# Patient Record
Sex: Male | Born: 1955 | Race: White | Hispanic: No | Marital: Married | Smoking: Never smoker
Health system: Southern US, Community
[De-identification: ages and names within clinical notes are randomized; demographics above are authoritative.]

## PROBLEM LIST (undated history)

## (undated) DIAGNOSIS — E785 Hyperlipidemia, unspecified: Secondary | ICD-10-CM

## (undated) DIAGNOSIS — E781 Pure hyperglyceridemia: Secondary | ICD-10-CM

## (undated) DIAGNOSIS — N63 Unspecified lump in unspecified breast: Secondary | ICD-10-CM

## (undated) DIAGNOSIS — I10 Essential (primary) hypertension: Secondary | ICD-10-CM

## (undated) DIAGNOSIS — I2510 Atherosclerotic heart disease of native coronary artery without angina pectoris: Secondary | ICD-10-CM

## (undated) HISTORY — DX: Hyperlipidemia, unspecified: E78.5

---

## 1995-01-22 HISTORY — PX: BICEPS TENDON REPAIR: SHX566

## 2012-12-02 HISTORY — PX: CORONARY ANGIOPLASTY WITH STENT PLACEMENT: SHX49

## 2014-07-17 ENCOUNTER — Encounter (HOSPITAL_BASED_OUTPATIENT_CLINIC_OR_DEPARTMENT_OTHER): Payer: Self-pay | Admitting: Emergency Medicine

## 2014-07-17 ENCOUNTER — Emergency Department (HOSPITAL_BASED_OUTPATIENT_CLINIC_OR_DEPARTMENT_OTHER)
Admission: EM | Admit: 2014-07-17 | Discharge: 2014-07-17 | Disposition: A | Discharge status: Home / Self Care | Payer: BLUE CROSS/BLUE SHIELD | Attending: Emergency Medicine | Admitting: Emergency Medicine

## 2014-07-17 ENCOUNTER — Emergency Department (HOSPITAL_BASED_OUTPATIENT_CLINIC_OR_DEPARTMENT_OTHER): Payer: BLUE CROSS/BLUE SHIELD

## 2014-07-17 DIAGNOSIS — S61211A Laceration without foreign body of left index finger without damage to nail, initial encounter: Secondary | ICD-10-CM | POA: Diagnosis not present

## 2014-07-17 DIAGNOSIS — S61219A Laceration without foreign body of unspecified finger without damage to nail, initial encounter: Secondary | ICD-10-CM

## 2014-07-17 DIAGNOSIS — Z880 Allergy status to penicillin: Secondary | ICD-10-CM | POA: Diagnosis not present

## 2014-07-17 DIAGNOSIS — Z23 Encounter for immunization: Secondary | ICD-10-CM | POA: Diagnosis not present

## 2014-07-17 DIAGNOSIS — Y998 Other external cause status: Secondary | ICD-10-CM | POA: Diagnosis not present

## 2014-07-17 DIAGNOSIS — Y9289 Other specified places as the place of occurrence of the external cause: Secondary | ICD-10-CM | POA: Insufficient documentation

## 2014-07-17 DIAGNOSIS — W260XXA Contact with knife, initial encounter: Secondary | ICD-10-CM | POA: Insufficient documentation

## 2014-07-17 DIAGNOSIS — I10 Essential (primary) hypertension: Secondary | ICD-10-CM | POA: Diagnosis not present

## 2014-07-17 DIAGNOSIS — I252 Old myocardial infarction: Secondary | ICD-10-CM | POA: Diagnosis not present

## 2014-07-17 DIAGNOSIS — Y93G9 Activity, other involving cooking and grilling: Secondary | ICD-10-CM | POA: Insufficient documentation

## 2014-07-17 HISTORY — DX: Essential (primary) hypertension: I10

## 2014-07-17 MED ORDER — LIDOCAINE HCL (PF) 1 % IJ SOLN
INTRAMUSCULAR | Status: AC
  Administered 2014-07-17: 10 mL
  Filled 2014-07-17: qty 5

## 2014-07-17 MED ORDER — BACITRACIN 500 UNIT/GM EX OINT
1.0000 "application " | TOPICAL_OINTMENT | Freq: Two times a day (BID) | CUTANEOUS | Status: DC
  Administered 2014-07-17: 1 via TOPICAL
  Filled 2014-07-17: qty 0.9

## 2014-07-17 MED ORDER — TETANUS-DIPHTH-ACELL PERTUSSIS 5-2.5-18.5 LF-MCG/0.5 IM SUSP
0.5000 mL | Freq: Once | INTRAMUSCULAR | Status: AC
  Administered 2014-07-17: 0.5 mL via INTRAMUSCULAR
  Filled 2014-07-17: qty 0.5

## 2014-07-17 MED ORDER — LIDOCAINE HCL (PF) 1 % IJ SOLN
10.0000 mL | Freq: Once | INTRAMUSCULAR | Status: AC
  Administered 2014-07-17: 10 mL

## 2014-07-17 NOTE — ED Provider Notes (Signed)
CSN: [id number]     Arrival date & time 07/17/14  1851 History   First MD Initiated Contact with Patient 07/17/14 2019     Chief Complaint  Patient presents with  . Extremity Laceration     (Consider location/radiation/quality/duration/timing/severity/associated sxs/prior Treatment) HPI   Blood pressure 139/94, pulse 60, temperature 98.4 F (36.9 C), temperature source Oral, resp. rate 18, height 6' (1.829 m), weight 220 lb (99.791 kg), SpO2 95 %.  Gregory Delacruz is a 59 y.o. male w/ h/o HTN and MI with stent placement who presents to the ED today with a laceration to the dorsal aspect of his left index finger.  He cut his finger with a serrated knife at 6:30 while cutting corn, he immediately applied pressure to the wound site and controlled the bleeding well.  He is ambidextrous, takes a baby aspirin daily, and his last tetanus shot was over 20 years ago.   Past Medical History  Diagnosis Date  . Hypertension   . Heart attack    Past Surgical History  Procedure Laterality Date  . Coronary angioplasty with stent placement     History reviewed. No pertinent family history. History  Substance Use Topics  . Smoking status: Never Smoker   . Smokeless tobacco: Not on file  . Alcohol Use: Yes     Comment: occasional    Review of Systems  10 systems reviewed and found to be negative, except as noted in the HPI.  Allergies  Penicillins  Home Medications   Prior to Admission medications   Not on File   BP 139/94 mmHg  Pulse 60  Temp(Src) 98.4 F (36.9 C) (Oral)  Resp 18  Ht 6' (1.829 m)  Wt 220 lb (99.791 kg)  BMI 29.83 kg/m2  SpO2 95% Physical Exam  Constitutional: He is oriented to person, place, and time. He appears well-developed and well-nourished. No distress.  HENT:  Head: Normocephalic.  Eyes: Conjunctivae and EOM are normal.  Cardiovascular: Normal rate.   Pulmonary/Chest: Effort normal. No stridor.  Musculoskeletal: Normal range of motion.   Hands: 3 cm flap-like laceration to dorsum of middle phalanx of left second digit.  Neurological: He is alert and oriented to person, place, and time.  Psychiatric: He has a normal mood and affect.  Nursing note and vitals reviewed.   ED Course  LACERATION REPAIR Date/Time: 07/17/2014 10:21 PM Performed by: Monico Blitz Authorized by: Monico Blitz Consent given by: patient Body area: upper extremity Location details: left index finger Laceration length: 3 cm Foreign bodies: no foreign bodies Tendon involvement: none Nerve involvement: none Vascular damage: no Anesthesia: digital block Local anesthetic: lidocaine 1% without epinephrine Anesthetic total: 5 ml Preparation: Patient was prepped and draped in the usual sterile fashion. Irrigation solution: saline Irrigation method: syringe Amount of cleaning: extensive Debridement: none Degree of undermining: none Skin closure: Ethilon (6-0) Number of sutures: 7 Technique: simple Approximation: close Approximation difficulty: simple Dressing: antibiotic ointment and splint Patient tolerance: Patient tolerated the procedure well with no immediate complications Comments: Wound explored to depth in good light on a bloodless field no foreign bodies are appreciated by visualization or by palpation.    SPLINT APPLICATION Date/Time: 70:62 PM Authorized by: Monico Blitz Consent: Verbal consent obtained. Risks and benefits: risks, benefits and alternatives were discussed Consent given by: patient Splint applied by: EMT Location details: Left index finger Splint type: Metal finger splint Post-procedure: The splinted body part was neurovascularly unchanged following the procedure. Patient tolerance: Patient tolerated the procedure well  with no immediate complications.    Labs Review Labs Reviewed - No data to display  Imaging Review Dg Finger Index Left  07/17/2014   CLINICAL DATA:  Laceration left index finger   EXAM: LEFT INDEX FINGER 2+V  COMPARISON:  None.  FINDINGS: No fracture or dislocation. 3 mm subtle geometric focus of increased density in the soft tissues along the ulnar margin of the second middle phalanx.  IMPRESSION: No fracture.  Possible focus of foreign body.   Electronically Signed   By: Skipper Cliche M.D.   On: 07/17/2014 19:45     EKG Interpretation None      MDM   Final diagnoses:  Finger laceration, initial encounter    Filed Vitals:   07/17/14 1901  BP: 139/94  Pulse: 60  Temp: 98.4 F (36.9 C)  TempSrc: Oral  Resp: 18  Height: 6' (1.829 m)  Weight: 220 lb (99.791 kg)  SpO2: 95%    Medications  lidocaine (PF) (XYLOCAINE) 1 % injection 10 mL (10 mLs Infiltration Given 07/17/14 2143)  Tdap (BOOSTRIX) injection 0.5 mL (0.5 mLs Intramuscular Given 07/17/14 2152)    Gregory Delacruz is a pleasant 59 y.o. male presenting with recent meter flap-like laceration to dorsum of left middle phalanx. X-rays read as possible foreign body however there is excellent visualization of this wound, no foreign bodies are in the wound. I have done an extensive irrigation and exploration. Wound is cleaned and closed. Extensive discussion of return precautions.  Evaluation does not show pathology that would require ongoing emergent intervention or inpatient treatment. Pt is hemodynamically stable and mentating appropriately. Discussed findings and plan with patient/guardian, who agrees with care plan. All questions answered. Return precautions discussed and outpatient follow up given.        Monico Blitz, PA-C 07/17/14 [place], MD 07/17/14 [phone]

## 2014-07-17 NOTE — Discharge Instructions (Signed)
Keep wound dry and do not remove dressing for 24 hours if possible. After that, wash gently morning and night (every 12 hours) with soap and water. Use a topical antibiotic ointment and cover with a bandaid or gauze.    Do NOT use rubbing alcohol or hydrogen peroxide, do not soak the area   Present to your primary care doctor or the urgent care of your choice, or the ED for suture removal in 7 days.   Every attempt was made to remove foreign body (contaminants) from the wound.  However, there is always a chance that some may remain in the wound. This can  increase your risk of infection.   If you see signs of infection (warmth, redness, tenderness, pus, sharp increase in pain, fever, red streaking in the skin) immediately return to the emergency department.   After the wound heals fully, apply sunscreen for 6-12 months to minimize scarring.   Laceration Care, Adult A laceration is a cut or lesion that goes through all layers of the skin and into the tissue just beneath the skin. TREATMENT  Some lacerations may not require closure. Some lacerations may not be able to be closed due to an increased risk of infection. It is important to see your caregiver as soon as possible after an injury to minimize the risk of infection and maximize the opportunity for successful closure. If closure is appropriate, pain medicines may be given, if needed. The wound will be cleaned to help prevent infection. Your caregiver will use stitches (sutures), staples, wound glue (adhesive), or skin adhesive strips to repair the laceration. These tools bring the skin edges together to allow for faster healing and a better cosmetic outcome. However, all wounds will heal with a scar. Once the wound has healed, scarring can be minimized by covering the wound with sunscreen during the day for 1 full year. HOME CARE INSTRUCTIONS  For sutures or staples:  Keep the wound clean and dry.  If you were given a bandage (dressing),  you should change it at least once a day. Also, change the dressing if it becomes wet or dirty, or as directed by your caregiver.  Wash the wound with soap and water 2 times a day. Rinse the wound off with water to remove all soap. Pat the wound dry with a clean towel.  After cleaning, apply a thin layer of the antibiotic ointment as recommended by your caregiver. This will help prevent infection and keep the dressing from sticking.  You may shower as usual after the first 24 hours. Do not soak the wound in water until the sutures are removed.  Only take over-the-counter or prescription medicines for pain, discomfort, or fever as directed by your caregiver.  Get your sutures or staples removed as directed by your caregiver. For skin adhesive strips:  Keep the wound clean and dry.  Do not get the skin adhesive strips wet. You may bathe carefully, using caution to keep the wound dry.  If the wound gets wet, pat it dry with a clean towel.  Skin adhesive strips will fall off on their own. You may trim the strips as the wound heals. Do not remove skin adhesive strips that are still stuck to the wound. They will fall off in time. For wound adhesive:  You may briefly wet your wound in the shower or bath. Do not soak or scrub the wound. Do not swim. Avoid periods of heavy perspiration until the skin adhesive has fallen off on  its own. After showering or bathing, gently pat the wound dry with a clean towel.  Do not apply liquid medicine, cream medicine, or ointment medicine to your wound while the skin adhesive is in place. This may loosen the film before your wound is healed.  If a dressing is placed over the wound, be careful not to apply tape directly over the skin adhesive. This may cause the adhesive to be pulled off before the wound is healed.  Avoid prolonged exposure to sunlight or tanning lamps while the skin adhesive is in place. Exposure to ultraviolet light in the first year will  darken the scar.  The skin adhesive will usually remain in place for 5 to 10 days, then naturally fall off the skin. Do not pick at the adhesive film. You may need a tetanus shot if:  You cannot remember when you had your last tetanus shot.  You have never had a tetanus shot. If you get a tetanus shot, your arm may swell, get red, and feel warm to the touch. This is common and not a problem. If you need a tetanus shot and you choose not to have one, there is a rare chance of getting tetanus. Sickness from tetanus can be serious. SEEK MEDICAL CARE IF:   You have redness, swelling, or increasing pain in the wound.  You see a red line that goes away from the wound.  You have yellowish-white fluid (pus) coming from the wound.  You have a fever.  You notice a bad smell coming from the wound or dressing.  Your wound breaks open before or after sutures have been removed.  You notice something coming out of the wound such as wood or glass.  Your wound is on your hand or foot and you cannot move a finger or toe. SEEK IMMEDIATE MEDICAL CARE IF:   Your pain is not controlled with prescribed medicine.  You have severe swelling around the wound causing pain and numbness or a change in color in your arm, hand, leg, or foot.  Your wound splits open and starts bleeding.  You have worsening numbness, weakness, or loss of function of any joint around or beyond the wound.  You develop painful lumps near the wound or on the skin anywhere on your body. MAKE SURE YOU:   Understand these instructions.  Will watch your condition.  Will get help right away if you are not doing well or get worse. Document Released: 01/07/2005 Document Revised: 04/01/2011 Document Reviewed: 07/03/2010 [facility] Patient Information 2015 [place], [place]. This information is not intended to replace advice given to you by your health care provider. Make sure you discuss any questions you have with your health care  provider.

## 2014-07-17 NOTE — ED Notes (Signed)
Pt in with laceration to L index finger. States she was cutting food and cut his finger in a filet fashion. Dressed and bleeding controlled at this time.

## 2014-07-22 ENCOUNTER — Telehealth: Payer: Self-pay | Admitting: *Deleted

## 2014-07-22 ENCOUNTER — Encounter: Payer: Self-pay | Admitting: *Deleted

## 2014-07-22 NOTE — Telephone Encounter (Deleted)
Pre-Visit Call completed with patient and chart updated.   Pre-Visit Info documented in Specialty Comments under [employer].    

## 2014-07-22 NOTE — Telephone Encounter (Signed)
Pre-Visit Call completed with patient and chart updated.   Pre-Visit Info documented in Specialty Comments under [employer].    

## 2014-07-22 NOTE — Telephone Encounter (Signed)
Unable to reach patient at time of Pre-Visit Call.  Left message for patient to return call when available.    

## 2014-07-22 NOTE — Addendum Note (Signed)
Addended by: Leticia Penna A on: 07/22/2014 11:48 AM   Modules accepted: Medications

## 2014-07-26 ENCOUNTER — Ambulatory Visit (INDEPENDENT_AMBULATORY_CARE_PROVIDER_SITE_OTHER): Payer: BLUE CROSS/BLUE SHIELD | Admitting: Physician Assistant

## 2014-07-26 ENCOUNTER — Encounter: Payer: Self-pay | Admitting: Physician Assistant

## 2014-07-26 ENCOUNTER — Other Ambulatory Visit: Payer: Self-pay | Admitting: Physician Assistant

## 2014-07-26 VITALS — BP 138/80 | HR 55 | Temp 97.8°F | Ht 70.5 in | Wt 251.6 lb

## 2014-07-26 DIAGNOSIS — G44209 Tension-type headache, unspecified, not intractable: Secondary | ICD-10-CM

## 2014-07-26 DIAGNOSIS — I2510 Atherosclerotic heart disease of native coronary artery without angina pectoris: Secondary | ICD-10-CM | POA: Insufficient documentation

## 2014-07-26 DIAGNOSIS — Z4802 Encounter for removal of sutures: Secondary | ICD-10-CM | POA: Diagnosis not present

## 2014-07-26 DIAGNOSIS — S61219D Laceration without foreign body of unspecified finger without damage to nail, subsequent encounter: Secondary | ICD-10-CM

## 2014-07-26 DIAGNOSIS — I10 Essential (primary) hypertension: Secondary | ICD-10-CM | POA: Insufficient documentation

## 2014-07-26 DIAGNOSIS — S61219A Laceration without foreign body of unspecified finger without damage to nail, initial encounter: Secondary | ICD-10-CM | POA: Insufficient documentation

## 2014-07-26 DIAGNOSIS — Z299 Encounter for prophylactic measures, unspecified: Secondary | ICD-10-CM

## 2014-07-26 LAB — HEMOGLOBIN A1C: Hgb A1c MFr Bld: 5.2 % (ref 4.6–6.5)

## 2014-07-26 LAB — URINALYSIS, ROUTINE W REFLEX MICROSCOPIC
Bilirubin Urine: NEGATIVE
Hgb urine dipstick: NEGATIVE
KETONES UR: NEGATIVE
Leukocytes, UA: NEGATIVE
Nitrite: NEGATIVE
PH: 6.5 (ref 5.0–8.0)
SPECIFIC GRAVITY, URINE: 1.01 (ref 1.000–1.030)
TOTAL PROTEIN, URINE-UPE24: NEGATIVE
UROBILINOGEN UA: 0.2 (ref 0.0–1.0)
Urine Glucose: NEGATIVE
WBC UA: NONE SEEN — AB (ref 0–?)

## 2014-07-26 LAB — CBC
HEMATOCRIT: 50.2 % (ref 39.0–52.0)
HEMOGLOBIN: 17.5 g/dL — AB (ref 13.0–17.0)
MCHC: 34.9 g/dL (ref 30.0–36.0)
MCV: 90.7 fl (ref 78.0–100.0)
Platelets: 220 10*3/uL (ref 150.0–400.0)
RBC: 5.53 Mil/uL (ref 4.22–5.81)
RDW: 12.7 % (ref 11.5–15.5)
WBC: 7.4 10*3/uL (ref 4.0–10.5)

## 2014-07-26 LAB — COMPREHENSIVE METABOLIC PANEL
ALBUMIN: 4.1 g/dL (ref 3.5–5.2)
ALT: 15 U/L (ref 0–53)
AST: 23 U/L (ref 0–37)
Alkaline Phosphatase: 65 U/L (ref 39–117)
BUN: 15 mg/dL (ref 6–23)
CALCIUM: 9.6 mg/dL (ref 8.4–10.5)
CO2: 20 meq/L (ref 19–32)
CREATININE: 0.97 mg/dL (ref 0.40–1.50)
Chloride: 104 mEq/L (ref 96–112)
GFR: 84.88 mL/min (ref >60.00)
Glucose, Bld: 98 mg/dL (ref 70–99)
Potassium: 4.2 mEq/L (ref 3.5–5.1)
Sodium: 135 mEq/L (ref 135–145)
Total Bilirubin: 0.5 mg/dL (ref 0.2–1.2)
Total Protein: 7 g/dL (ref 6.0–8.3)

## 2014-07-26 LAB — TSH: TSH: 1.8 u[IU]/mL (ref 0.35–4.50)

## 2014-07-26 LAB — LIPID PANEL
CHOL/HDL RATIO: 9
CHOLESTEROL: 276 mg/dL — AB (ref 0–200)
HDL: 30.9 mg/dL — ABNORMAL LOW (ref >39.00)
Triglycerides: 1442 mg/dL — ABNORMAL HIGH (ref 0.0–149.0)

## 2014-07-26 LAB — LDL CHOLESTEROL, DIRECT: LDL DIRECT: 86 mg/dL

## 2014-07-26 LAB — PSA: PSA: 0.5 ng/mL (ref 0.10–4.00)

## 2014-07-26 MED ORDER — EPINEPHRINE 0.3 MG/0.3ML IJ SOAJ: 0.3000 mg | Freq: Once | INTRAMUSCULAR | Status: DC

## 2014-07-26 MED ORDER — ATENOLOL 25 MG PO TABS
25.0000 mg | ORAL_TABLET | Freq: Two times a day (BID) | ORAL | Status: DC
Start: 2014-07-26 — End: 2015-09-18

## 2014-07-26 MED ORDER — TIZANIDINE HCL 2 MG PO TABS: 2.0000 mg | ORAL_TABLET | Freq: Three times a day (TID) | ORAL | Status: DC | PRN

## 2014-07-26 NOTE — Assessment & Plan Note (Signed)
Wound healing nicely. No evidence of infection. Sutures removed with kit. No complications. Follow-up PRN. Signs/symptoms of infection discuss with patient.

## 2014-07-26 NOTE — Assessment & Plan Note (Signed)
Stable. Asymptomatic. Continue BB.

## 2014-07-26 NOTE — Progress Notes (Signed)
Pre visit review using our clinic review tool, if applicable. No additional management support is needed unless otherwise documented below in the visit note. 

## 2014-07-26 NOTE — Progress Notes (Signed)
Gregory Delacruz presents to clinic today to establish care.  Acute Concerns: Gregory Delacruz seen in ER one week ago for uncomplicated laceration to left index finger. Sutures were placed. Gregory Delacruz endorses good healing. Denies fever, chills, pain, redness, warmth or drainage at site. Is due for suture removal.  Chronic Issues: Hypertension -- S/P MI in 2009 with stent placed in LAD. Was previously followed by Cardiology in [facility] but has not established new specialist since move to this area. Is currently taking Atenolol 25 mg BID. Endorses Bp previously well controlled. Gregory Delacruz denies chest pain, palpitations, lightheadedness, dizziness, vision changes or frequent headaches. Is taking ASA 81 mg daily. Endorses previously on Lipitor but stopped medication due to myalgias.   Past Medical History  Diagnosis Date  . Hypertension   . Heart attack     Past Surgical History  Procedure Laterality Date  . Coronary angioplasty with stent placement      Current Outpatient Prescriptions on File Prior to Visit  Medication Sig Dispense Refill  . aspirin 81 MG tablet Take 81 mg by mouth daily.     No current facility-administered medications on file prior to visit.    Allergies  Allergen Reactions  . Shellfish Allergy Swelling  . Penicillins Itching    Family History  Problem Relation Age of Onset  . Diabetes Mother   . Hypertension Mother   . Cancer Father 33    prostate   . Heart disease Father 62    triple bypass   . Diabetes Father   . Hypertension Father     History   Social History  . Marital Status: Married    Spouse Name: N/A  . Number of Children: N/A  . Years of Education: N/A   Occupational History  . Not on file.   Social History Main Topics  . Smoking status: Never Smoker   . Smokeless tobacco: Never Used  . Alcohol Use: 0.0 oz/week    0 Standard drinks or equivalent per week     Comment: occasional  . Drug Use: No  . Sexual Activity: Not on file   Other  Topics Concern  . Not on file   Social History Narrative   Review of Systems  Respiratory: Negative for cough and shortness of breath.   Cardiovascular: Negative for chest pain and palpitations.  Musculoskeletal: Positive for neck pain.  Neurological: Positive for headaches. Negative for dizziness and loss of consciousness.  Psychiatric/Behavioral: Negative for depression. The Gregory Delacruz does not have insomnia.    BP 138/80 mmHg  Pulse 55  Temp(Src) 97.8 F (36.6 C) (Oral)  Ht 5' 10.5" (1.791 m)  Wt 251 lb 9.6 oz (114.125 kg)  BMI 35.58 kg/m2  SpO2 98%  Physical Exam  Constitutional: He is oriented to person, place, and time.  Cardiovascular: Normal rate, regular rhythm, normal heart sounds and intact distal pulses.   Pulmonary/Chest: Effort normal and breath sounds normal. No respiratory distress. He has no wheezes. He has no rales. He exhibits no tenderness.  Musculoskeletal:       Cervical back: He exhibits spasm. He exhibits no tenderness and no bony tenderness.  Neurological: He is alert and oriented to person, place, and time.  Skin: Skin is warm and dry. No rash noted.  Healed laceration of posterior L index finger with sutures in place (7)  Psychiatric: Affect normal.  Vitals reviewed.  Assessment/Plan: Finger laceration Wound healing nicely. No evidence of infection. Sutures removed with kit. No complications. Follow-up PRN. Signs/symptoms of  infection discuss with Gregory Delacruz.  Essential hypertension Stable. Asymptomatic. Continue BB.  Coronary artery disease S/p MI in 2009. 100% blockage of LAD per Gregory Delacruz. Stent placed. BP controlled. Will check fasting lipid panel. Continue BB therapy. Will restart statin with dose based on lipids. Referral to Cardiology placed.  Tension headache Topical Aspercreme. Supportive measures reviewed. Rx Zanaflex for acute headache. No driving while on medication. Follow-up if symptoms are not resolving.

## 2014-07-26 NOTE — Patient Instructions (Signed)
Please keep wound clean and dry. It is healing nicely. If you notice any redness, warmth, drainage or increased tenderness, please come see me.  Stop by the lab for blood work. We will discuss results at your physical in 1 week.

## 2014-07-26 NOTE — Assessment & Plan Note (Signed)
Topical Aspercreme. Supportive measures reviewed. Rx Zanaflex for acute headache. No driving while on medication. Follow-up if symptoms are not resolving.

## 2014-07-26 NOTE — Assessment & Plan Note (Signed)
S/p MI in 2009. 100% blockage of LAD per patient. Stent placed. BP controlled. Will check fasting lipid panel. Continue BB therapy. Will restart statin with dose based on lipids. Referral to Cardiology placed.

## 2014-07-27 MED ORDER — FENOFIBRATE 145 MG PO TABS: 145.0000 mg | ORAL_TABLET | Freq: Every day | ORAL | Status: DC

## 2014-07-27 NOTE — Addendum Note (Signed)
Addended by: Leticia Penna A on: 07/27/2014 04:05 PM   Modules accepted: Orders

## 2014-08-02 ENCOUNTER — Telehealth: Payer: Self-pay | Admitting: Behavioral Health

## 2014-08-02 NOTE — Telephone Encounter (Signed)
Unable to reach patient at time of Pre-Visit Call.  Left message for patient to return call when available.    

## 2014-08-03 ENCOUNTER — Ambulatory Visit (INDEPENDENT_AMBULATORY_CARE_PROVIDER_SITE_OTHER): Payer: BLUE CROSS/BLUE SHIELD | Admitting: Physician Assistant

## 2014-08-03 ENCOUNTER — Encounter: Payer: Self-pay | Admitting: Physician Assistant

## 2014-08-03 ENCOUNTER — Encounter: Payer: Self-pay | Admitting: Gastroenterology

## 2014-08-03 VITALS — BP 139/89 | HR 55 | Temp 97.9°F | Ht 70.0 in | Wt 249.8 lb

## 2014-08-03 DIAGNOSIS — E781 Pure hyperglyceridemia: Secondary | ICD-10-CM

## 2014-08-03 DIAGNOSIS — Z1211 Encounter for screening for malignant neoplasm of colon: Secondary | ICD-10-CM | POA: Diagnosis not present

## 2014-08-03 DIAGNOSIS — I10 Essential (primary) hypertension: Secondary | ICD-10-CM | POA: Diagnosis not present

## 2014-08-03 DIAGNOSIS — Z0000 Encounter for general adult medical examination without abnormal findings: Secondary | ICD-10-CM | POA: Insufficient documentation

## 2014-08-03 DIAGNOSIS — H6123 Impacted cerumen, bilateral: Secondary | ICD-10-CM | POA: Diagnosis not present

## 2014-08-03 DIAGNOSIS — H6120 Impacted cerumen, unspecified ear: Secondary | ICD-10-CM | POA: Insufficient documentation

## 2014-08-03 NOTE — Assessment & Plan Note (Signed)
Successfully removed via irrigation with resolution of symptoms. 

## 2014-08-03 NOTE — Patient Instructions (Addendum)
Please continue medications as directed.  Continue limiting alcohol consumption.  Return to the lab in 4 weeks for repeat fasting cholesterol panel to reassess your triglycerides. I have already placed the orders.  Follow-up with me in 6 months for chronic medical conditions.  Preventive Care for Adults A healthy lifestyle and preventive care can promote health and wellness. Preventive health guidelines for men include the following key practices:  A routine yearly physical is a good way to check with your health care provider about your health and preventative screening. It is a chance to share any concerns and updates on your health and to receive a thorough exam.  Visit your dentist for a routine exam and preventative care every 6 months. Brush your teeth twice a day and floss once a day. Good oral hygiene prevents tooth decay and gum disease.  The frequency of eye exams is based on your age, health, family medical history, use of contact lenses, and other factors. Follow your health care provider's recommendations for frequency of eye exams.  Eat a healthy diet. Foods such as vegetables, fruits, whole grains, low-fat dairy products, and lean protein foods contain the nutrients you need without too many calories. Decrease your intake of foods high in solid fats, added sugars, and salt. Eat the right amount of calories for you.Get information about a proper diet from your health care provider, if necessary.  Regular physical exercise is one of the most important things you can do for your health. Most adults should get at least 150 minutes of moderate-intensity exercise (any activity that increases your heart rate and causes you to sweat) each week. In addition, most adults need muscle-strengthening exercises on 2 or more days a week.  Maintain a healthy weight. The body mass index (BMI) is a screening tool to identify possible weight problems. It provides an estimate of body fat based on  height and weight. Your health care provider can find your BMI and can help you achieve or maintain a healthy weight.For adults 20 years and older:  A BMI below 18.5 is considered underweight.  A BMI of 18.5 to 24.9 is normal.  A BMI of 25 to 29.9 is considered overweight.  A BMI of 30 and above is considered obese.  Maintain normal blood lipids and cholesterol levels by exercising and minimizing your intake of saturated fat. Eat a balanced diet with plenty of fruit and vegetables. Blood tests for lipids and cholesterol should begin at age 78 and be repeated every 5 years. If your lipid or cholesterol levels are high, you are over 50, or you are at high risk for heart disease, you may need your cholesterol levels checked more frequently.Ongoing high lipid and cholesterol levels should be treated with medicines if diet and exercise are not working.  If you smoke, find out from your health care provider how to quit. If you do not use tobacco, do not start.  Lung cancer screening is recommended for adults aged 23-80 years who are at high risk for developing lung cancer because of a history of smoking. A yearly low-dose CT scan of the lungs is recommended for people who have at least a 30-pack-year history of smoking and are a current smoker or have quit within the past 15 years. A pack year of smoking is smoking an average of 1 pack of cigarettes a day for 1 year (for example: 1 pack a day for 30 years or 2 packs a day for 15 years). Yearly screening should  continue until the smoker has stopped smoking for at least 15 years. Yearly screening should be stopped for people who develop a health problem that would prevent them from having lung cancer treatment.  If you choose to drink alcohol, do not have more than 2 drinks per day. One drink is considered to be 12 ounces (355 mL) of beer, 5 ounces (148 mL) of wine, or 1.5 ounces (44 mL) of liquor.  Avoid use of street drugs. Do not share needles with  anyone. Ask for help if you need support or instructions about stopping the use of drugs.  High blood pressure causes heart disease and increases the risk of stroke. Your blood pressure should be checked at least every 1-2 years. Ongoing high blood pressure should be treated with medicines, if weight loss and exercise are not effective.  If you are 9-39 years old, ask your health care provider if you should take aspirin to prevent heart disease.  Diabetes screening involves taking a blood sample to check your fasting blood sugar level. This should be done once every 3 years, after age 37, if you are within normal weight and without risk factors for diabetes. Testing should be considered at a younger age or be carried out more frequently if you are overweight and have at least 1 risk factor for diabetes.  Colorectal cancer can be detected and often prevented. Most routine colorectal cancer screening begins at the age of 21 and continues through age 58. However, your health care provider may recommend screening at an earlier age if you have risk factors for colon cancer. On a yearly basis, your health care provider may provide home test kits to check for hidden blood in the stool. Use of a small camera at the end of a tube to directly examine the colon (sigmoidoscopy or colonoscopy) can detect the earliest forms of colorectal cancer. Talk to your health care provider about this at age 57, when routine screening begins. Direct exam of the colon should be repeated every 5-10 years through age 41, unless early forms of precancerous polyps or small growths are found.  People who are at an increased risk for hepatitis B should be screened for this virus. You are considered at high risk for hepatitis B if:  You were born in a country where hepatitis B occurs often. Talk with your health care provider about which countries are considered high risk.  Your parents were born in a high-risk country and you have  not received a shot to protect against hepatitis B (hepatitis B vaccine).  You have HIV or AIDS.  You use needles to inject street drugs.  You live with, or have sex with, someone who has hepatitis B.  You are a man who has sex with other men (MSM).  You get hemodialysis treatment.  You take certain medicines for conditions such as cancer, organ transplantation, and autoimmune conditions.  Hepatitis C blood testing is recommended for all people born from [birth year] through 1965 and any individual with known risks for hepatitis C.  Practice safe sex. Use condoms and avoid high-risk sexual practices to reduce the spread of sexually transmitted infections (STIs). STIs include gonorrhea, chlamydia, syphilis, trichomonas, herpes, HPV, and human immunodeficiency virus (HIV). Herpes, HIV, and HPV are viral illnesses that have no cure. They can result in disability, cancer, and death.  If you are at risk of being infected with HIV, it is recommended that you take a prescription medicine daily to prevent HIV infection. This  is called preexposure prophylaxis (PrEP). You are considered at risk if:  You are a man who has sex with other men (MSM) and have other risk factors.  You are a heterosexual man, are sexually active, and are at increased risk for HIV infection.  You take drugs by injection.  You are sexually active with a partner who has HIV.  Talk with your health care provider about whether you are at high risk of being infected with HIV. If you choose to begin PrEP, you should first be tested for HIV. You should then be tested every 3 months for as long as you are taking PrEP.  A one-time screening for abdominal aortic aneurysm (AAA) and surgical repair of large AAAs by ultrasound are recommended for men ages 21 to 66 years who are current or former smokers.  Healthy men should no longer receive prostate-specific antigen (PSA) blood tests as part of routine cancer screening. Talk with your  health care provider about prostate cancer screening.  Testicular cancer screening is not recommended for adult males who have no symptoms. Screening includes self-exam, a health care provider exam, and other screening tests. Consult with your health care provider about any symptoms you have or any concerns you have about testicular cancer.  Use sunscreen. Apply sunscreen liberally and repeatedly throughout the day. You should seek shade when your shadow is shorter than you. Protect yourself by wearing long sleeves, pants, a wide-brimmed hat, and sunglasses year round, whenever you are outdoors.  Once a month, do a whole-body skin exam, using a mirror to look at the skin on your back. Tell your health care provider about new moles, moles that have irregular borders, moles that are larger than a pencil eraser, or moles that have changed in shape or color.  Stay current with required vaccines (immunizations).  Influenza vaccine. All adults should be immunized every year.  Tetanus, diphtheria, and acellular pertussis (Td, Tdap) vaccine. An adult who has not previously received Tdap or who does not know his vaccine status should receive 1 dose of Tdap. This initial dose should be followed by tetanus and diphtheria toxoids (Td) booster doses every 10 years. Adults with an unknown or incomplete history of completing a 3-dose immunization series with Td-containing vaccines should begin or complete a primary immunization series including a Tdap dose. Adults should receive a Td booster every 10 years.  Varicella vaccine. An adult without evidence of immunity to varicella should receive 2 doses or a second dose if he has previously received 1 dose.  Human papillomavirus (HPV) vaccine. Males aged 91-21 years who have not received the vaccine previously should receive the 3-dose series. Males aged 22-26 years may be immunized. Immunization is recommended through the age of 23 years for any male who has sex with  males and did not get any or all doses earlier. Immunization is recommended for any person with an immunocompromised condition through the age of 37 years if he did not get any or all doses earlier. During the 3-dose series, the second dose should be obtained 4-8 weeks after the first dose. The third dose should be obtained 24 weeks after the first dose and 16 weeks after the second dose.  Zoster vaccine. One dose is recommended for adults aged 38 years or older unless certain conditions are present.  Measles, mumps, and rubella (MMR) vaccine. Adults born before [birth year] generally are considered immune to measles and mumps. Adults born in [birth year] or later should have 1 or more doses  of MMR vaccine unless there is a contraindication to the vaccine or there is laboratory evidence of immunity to each of the three diseases. A routine second dose of MMR vaccine should be obtained at least 28 days after the first dose for students attending postsecondary schools, health care workers, or international travelers. People who received inactivated measles vaccine or an unknown type of measles vaccine during 1963-1967 should receive 2 doses of MMR vaccine. People who received inactivated mumps vaccine or an unknown type of mumps vaccine before 1979 and are at high risk for mumps infection should consider immunization with 2 doses of MMR vaccine. Unvaccinated health care workers born before [birth year] who lack laboratory evidence of measles, mumps, or rubella immunity or laboratory confirmation of disease should consider measles and mumps immunization with 2 doses of MMR vaccine or rubella immunization with 1 dose of MMR vaccine.  Pneumococcal 13-valent conjugate (PCV13) vaccine. When indicated, a person who is uncertain of his immunization history and has no record of immunization should receive the PCV13 vaccine. An adult aged 93 years or older who has certain medical conditions and has not been previously immunized should receive 1  dose of PCV13 vaccine. This PCV13 should be followed with a dose of pneumococcal polysaccharide (PPSV23) vaccine. The PPSV23 vaccine dose should be obtained at least 8 weeks after the dose of PCV13 vaccine. An adult aged 45 years or older who has certain medical conditions and previously received 1 or more doses of PPSV23 vaccine should receive 1 dose of PCV13. The PCV13 vaccine dose should be obtained 1 or more years after the last PPSV23 vaccine dose.  Pneumococcal polysaccharide (PPSV23) vaccine. When PCV13 is also indicated, PCV13 should be obtained first. All adults aged 41 years and older should be immunized. An adult younger than age 8 years who has certain medical conditions should be immunized. Any person who resides in a nursing home or long-term care facility should be immunized. An adult smoker should be immunized. People with an immunocompromised condition and certain other conditions should receive both PCV13 and PPSV23 vaccines. People with human immunodeficiency virus (HIV) infection should be immunized as soon as possible after diagnosis. Immunization during chemotherapy or radiation therapy should be avoided. Routine use of PPSV23 vaccine is not recommended for American Indians, [place] Natives, or people younger than 65 years unless there are medical conditions that require PPSV23 vaccine. When indicated, people who have unknown immunization and have no record of immunization should receive PPSV23 vaccine. One-time revaccination 5 years after the first dose of PPSV23 is recommended for people aged 19-64 years who have chronic kidney failure, nephrotic syndrome, asplenia, or immunocompromised conditions. People who received 1-2 doses of PPSV23 before age 3 years should receive another dose of PPSV23 vaccine at age 40 years or later if at least 5 years have passed since the previous dose. Doses of PPSV23 are not needed for people immunized with PPSV23 at or after age 64 years.  Meningococcal  vaccine. Adults with asplenia or persistent complement component deficiencies should receive 2 doses of quadrivalent meningococcal conjugate (MenACWY-D) vaccine. The doses should be obtained at least 2 months apart. Microbiologists working with certain meningococcal bacteria, Saline recruits, people at risk during an outbreak, and people who travel to or live in countries with a high rate of meningitis should be immunized. A first-year college student up through age 91 years who is living in a residence hall should receive a dose if he did not receive a dose on or after  his 16th birthday. Adults who have certain high-risk conditions should receive one or more doses of vaccine.  Hepatitis A vaccine. Adults who wish to be protected from this disease, have certain high-risk conditions, work with hepatitis A-infected animals, work in hepatitis A research labs, or travel to or work in countries with a high rate of hepatitis A should be immunized. Adults who were previously unvaccinated and who anticipate close contact with an international adoptee during the first 60 days after arrival in the [place] from a country with a high rate of hepatitis A should be immunized.  Hepatitis B vaccine. Adults should be immunized if they wish to be protected from this disease, have certain high-risk conditions, may be exposed to blood or other infectious body fluids, are household contacts or sex partners of hepatitis B positive people, are clients or workers in certain care facilities, or travel to or work in countries with a high rate of hepatitis B.  Haemophilus influenzae type b (Hib) vaccine. A previously unvaccinated person with asplenia or sickle cell disease or having a scheduled splenectomy should receive 1 dose of Hib vaccine. Regardless of previous immunization, a recipient of a hematopoietic stem cell transplant should receive a 3-dose series 6-12 months after his successful transplant. Hib vaccine is not  recommended for adults with HIV infection. Preventive Service / Frequency Ages 45 to 26  Blood pressure check.** / Every 1 to 2 years.  Lipid and cholesterol check.** / Every 5 years beginning at age 105.  Hepatitis C blood test.** / For any individual with known risks for hepatitis C.  Skin self-exam. / Monthly.  Influenza vaccine. / Every year.  Tetanus, diphtheria, and acellular pertussis (Tdap, Td) vaccine.** / Consult your health care provider. 1 dose of Td every 10 years.  Varicella vaccine.** / Consult your health care provider.  HPV vaccine. / 3 doses over 6 months, if 46 or younger.  Measles, mumps, rubella (MMR) vaccine.** / You need at least 1 dose of MMR if you were born in 1957 or later. You may also need a second dose.  Pneumococcal 13-valent conjugate (PCV13) vaccine.** / Consult your health care provider.  Pneumococcal polysaccharide (PPSV23) vaccine.** / 1 to 2 doses if you smoke cigarettes or if you have certain conditions.  Meningococcal vaccine.** / 1 dose if you are age 45 to 64 years and a Market researcher living in a residence hall, or have one of several medical conditions. You may also need additional booster doses.  Hepatitis A vaccine.** / Consult your health care provider.  Hepatitis B vaccine.** / Consult your health care provider.  Haemophilus influenzae type b (Hib) vaccine.** / Consult your health care provider. Ages 54 to 40  Blood pressure check.** / Every 1 to 2 years.  Lipid and cholesterol check.** / Every 5 years beginning at age 5.  Lung cancer screening. / Every year if you are aged 14-80 years and have a 30-pack-year history of smoking and currently smoke or have quit within the past 15 years. Yearly screening is stopped once you have quit smoking for at least 15 years or develop a health problem that would prevent you from having lung cancer treatment.  Fecal occult blood test (FOBT) of stool. / Every year beginning at age  52 and continuing until age 23. You may not have to do this test if you get a colonoscopy every 10 years.  Flexible sigmoidoscopy** or colonoscopy.** / Every 5 years for a flexible sigmoidoscopy or every 10  years for a colonoscopy beginning at age 90 and continuing until age 101.  Hepatitis C blood test.** / For all people born from [birth year] through 1965 and any individual with known risks for hepatitis C.  Skin self-exam. / Monthly.  Influenza vaccine. / Every year.  Tetanus, diphtheria, and acellular pertussis (Tdap/Td) vaccine.** / Consult your health care provider. 1 dose of Td every 10 years.  Varicella vaccine.** / Consult your health care provider.  Zoster vaccine.** / 1 dose for adults aged 40 years or older.  Measles, mumps, rubella (MMR) vaccine.** / You need at least 1 dose of MMR if you were born in 1955 or later. You may also need a second dose.  Pneumococcal 13-valent conjugate (PCV13) vaccine.** / Consult your health care provider.  Pneumococcal polysaccharide (PPSV23) vaccine.** / 1 to 2 doses if you smoke cigarettes or if you have certain conditions.  Meningococcal vaccine.** / Consult your health care provider.  Hepatitis A vaccine.** / Consult your health care provider.  Hepatitis B vaccine.** / Consult your health care provider.  Haemophilus influenzae type b (Hib) vaccine.** / Consult your health care provider. Ages 59 and over  Blood pressure check.** / Every 1 to 2 years.  Lipid and cholesterol check.**/ Every 5 years beginning at age 77.  Lung cancer screening. / Every year if you are aged 66-80 years and have a 30-pack-year history of smoking and currently smoke or have quit within the past 15 years. Yearly screening is stopped once you have quit smoking for at least 15 years or develop a health problem that would prevent you from having lung cancer treatment.  Fecal occult blood test (FOBT) of stool. / Every year beginning at age 64 and continuing until age  23. You may not have to do this test if you get a colonoscopy every 10 years.  Flexible sigmoidoscopy** or colonoscopy.** / Every 5 years for a flexible sigmoidoscopy or every 10 years for a colonoscopy beginning at age 67 and continuing until age 40.  Hepatitis C blood test.** / For all people born from [birth year] through 1963 and any individual with known risks for hepatitis C.  Abdominal aortic aneurysm (AAA) screening.** / A one-time screening for ages 20 to 34 years who are current or former smokers.  Skin self-exam. / Monthly.  Influenza vaccine. / Every year.  Tetanus, diphtheria, and acellular pertussis (Tdap/Td) vaccine.** / 1 dose of Td every 10 years.  Varicella vaccine.** / Consult your health care provider.  Zoster vaccine.** / 1 dose for adults aged 16 years or older.  Pneumococcal 13-valent conjugate (PCV13) vaccine.** / Consult your health care provider.  Pneumococcal polysaccharide (PPSV23) vaccine.** / 1 dose for all adults aged 10 years and older.  Meningococcal vaccine.** / Consult your health care provider.  Hepatitis A vaccine.** / Consult your health care provider.  Hepatitis B vaccine.** / Consult your health care provider.  Haemophilus influenzae type b (Hib) vaccine.** / Consult your health care provider. **Family history and personal history of risk and conditions may change your health care provider's recommendations. Document Released: 03/05/2001 Document Revised: 01/12/2013 Document Reviewed: 06/04/2010 [facility] Patient Information 2015 [place], [place]. This information is not intended to replace advice given to you by your health care provider. Make sure you discuss any questions you have with your health care provider.

## 2014-08-03 NOTE — Progress Notes (Signed)
Patient presents to clinic today for annual exam.  Patient has labs obtained at last visit. Will be reviewed today.  Chronic Issues: Hypertension -- Is taking medications as directed. Patient denies chest pain, palpitations, lightheadedness, dizziness, vision changes or frequent headaches.  BP Readings from Last 3 Encounters:  08/03/14 139/89  07/26/14 138/80  07/17/14 139/94   Health Maintenance: Dental -- up-to-date Vision -- up-to-date Immunizations -- up-to-date Colonoscopy -- Due for screening colonoscopy.  Past Medical History  Diagnosis Date  . Hypertension   . Heart attack     Past Surgical History  Procedure Laterality Date  . Coronary angioplasty with stent placement      Current Outpatient Prescriptions on File Prior to Visit  Medication Sig Dispense Refill  . aspirin 81 MG tablet Take 81 mg by mouth daily.    Marland Kitchen atenolol (TENORMIN) 25 MG tablet Take 1 tablet (25 mg total) by mouth 2 (two) times daily. 90 tablet 1  . EPINEPHrine 0.3 mg/0.3 mL IJ SOAJ injection Inject 0.3 mLs (0.3 mg total) into the muscle once. 1 Device 0  . fenofibrate (TRICOR) 145 MG tablet Take 1 tablet (145 mg total) by mouth daily. 30 tablet 2   No current facility-administered medications on file prior to visit.    Allergies  Allergen Reactions  . Shellfish Allergy Swelling  . Penicillins Itching    Family History  Problem Relation Age of Onset  . Diabetes Mother   . Hypertension Mother   . Cancer Father 42    prostate   . Heart disease Father 56    triple bypass   . Diabetes Father   . Hypertension Father     History   Social History  . Marital Status: Married    Spouse Name: N/A  . Number of Children: N/A  . Years of Education: N/A   Occupational History  . Not on file.   Social History Main Topics  . Smoking status: Never Smoker   . Smokeless tobacco: Never Used  . Alcohol Use: 0.6 - 1.2 oz/week    1-2 Standard drinks or equivalent per week     Comment:  occasional  . Drug Use: No  . Sexual Activity: Not Currently   Other Topics Concern  . Not on file   Social History Narrative   Review of Systems  Constitutional: Negative for fever and weight loss.  HENT: Negative for ear discharge, ear pain, hearing loss and tinnitus.   Eyes: Negative for blurred vision, double vision, photophobia and pain.  Respiratory: Negative for cough and shortness of breath.   Cardiovascular: Negative for chest pain and palpitations.  Gastrointestinal: Negative for heartburn, nausea, vomiting, abdominal pain, diarrhea, constipation, blood in stool and melena.  Genitourinary: Negative for dysuria, urgency, frequency, hematuria and flank pain.  Musculoskeletal: Negative for falls.  Neurological: Negative for dizziness, loss of consciousness and headaches.  Endo/Heme/Allergies: Negative for environmental allergies.  Psychiatric/Behavioral: Negative for depression, suicidal ideas, hallucinations and substance abuse. The patient is not nervous/anxious and does not have insomnia.    BP 139/89 mmHg  Pulse 55  Temp(Src) 97.9 F (36.6 C)  Ht 5' 10"  (1.778 m)  Wt 249 lb 12.8 oz (113.309 kg)  BMI 35.84 kg/m2  SpO2 98%  Physical Exam  Constitutional: He is oriented to person, place, and time and well-developed, well-nourished, and in no distress.  HENT:  Head: Normocephalic and atraumatic.  Right Ear: External ear normal.  Left Ear: External ear normal.  Nose: Nose normal.  Mouth/Throat: Oropharynx is clear and moist. No oropharyngeal exudate.  Cerumen impaction bilaterally.  Eyes: Conjunctivae and EOM are normal. Pupils are equal, round, and reactive to light.  Neck: Neck supple. No thyromegaly present.  Cardiovascular: Normal rate, regular rhythm, normal heart sounds and intact distal pulses.   Pulmonary/Chest: Effort normal and breath sounds normal. No respiratory distress. He has no wheezes. He has no rales. He exhibits no tenderness.  Abdominal: Soft.  Bowel sounds are normal. He exhibits no distension and no mass. There is no tenderness. There is no rebound and no guarding.  Genitourinary: Testes/scrotum normal and penis normal. No discharge found.  Lymphadenopathy:    He has no cervical adenopathy.  Neurological: He is alert and oriented to person, place, and time.  Skin: Skin is warm and dry. No rash noted.  Psychiatric: Affect normal.  Vitals reviewed.   Recent Results (from the past 2160 hour(s))  CBC     Status: Abnormal   Collection Time: 07/26/14  9:53 AM  Result Value Ref Range   WBC 7.4 4.0 - 10.5 K/uL   RBC 5.53 4.22 - 5.81 Mil/uL   Platelets 220.0 150.0 - 400.0 K/uL   Hemoglobin 17.5 (H) 13.0 - 17.0 g/dL   HCT 50.2 39.0 - 52.0 %   MCV 90.7 78.0 - 100.0 fl   MCHC 34.9 30.0 - 36.0 g/dL   RDW 12.7 11.5 - 15.5 %  Comp Met (CMET)     Status: None   Collection Time: 07/26/14  9:53 AM  Result Value Ref Range   Sodium 135 135 - 145 mEq/L   Potassium 4.2 3.5 - 5.1 mEq/L   Chloride 104 96 - 112 mEq/L   CO2 20 19 - 32 mEq/L   Glucose, Bld 98 70 - 99 mg/dL   BUN 15 6 - 23 mg/dL   Creatinine, Ser 0.97 0.40 - 1.50 mg/dL   Total Bilirubin 0.5 0.2 - 1.2 mg/dL   Alkaline Phosphatase 65 39 - 117 U/L   AST 23 0 - 37 U/L   ALT 15 0 - 53 U/L   Total Protein 7.0 6.0 - 8.3 g/dL   Albumin 4.1 3.5 - 5.2 g/dL   Calcium 9.6 8.4 - 10.5 mg/dL   GFR 84.88 >60.00 mL/min  TSH     Status: None   Collection Time: 07/26/14  9:53 AM  Result Value Ref Range   TSH 1.80 0.35 - 4.50 uIU/mL  Hemoglobin A1c     Status: None   Collection Time: 07/26/14  9:53 AM  Result Value Ref Range   Hgb A1c MFr Bld 5.2 4.6 - 6.5 %    Comment: Glycemic Control Guidelines for People with Diabetes:Non Diabetic:  <6%Goal of Therapy: <7%Additional Action Suggested:  >8%   Urinalysis, Routine w reflex microscopic     Status: Abnormal   Collection Time: 07/26/14  9:53 AM  Result Value Ref Range   Color, Urine YELLOW Yellow;Lt. Yellow   APPearance CLEAR Clear    Specific Gravity, Urine 1.010 1.000-1.030   pH 6.5 5.0 - 8.0   Total Protein, Urine NEGATIVE Negative   Urine Glucose NEGATIVE Negative   Ketones, ur NEGATIVE Negative   Bilirubin Urine NEGATIVE Negative   Hgb urine dipstick NEGATIVE Negative   Urobilinogen, UA 0.2 0.0 - 1.0   Leukocytes, UA NEGATIVE Negative   Nitrite NEGATIVE Negative   WBC, UA No formed elements seen on urine microscopic exam. (A) 0-2/hpf  Lipid Profile     Status: Abnormal  Collection Time: 07/26/14  9:53 AM  Result Value Ref Range   Cholesterol 276 (H) 0 - 200 mg/dL    Comment: ATP III Classification       Desirable:  < 200 mg/dL               Borderline High:  200 - 239 mg/dL          High:  > = 240 mg/dL   Triglycerides (H) 0.0 - 149.0 mg/dL    1442.0 Triglyceride is over 400; calculations on Lipids are invalid.    Comment: Normal:  <150 mg/dLBorderline High:  150 - 199 mg/dL   HDL 30.90 (L) >39.00 mg/dL   Total CHOL/HDL Ratio 9     Comment:                Men          Women1/2 Average Risk     3.4          3.3Average Risk          5.0          4.42X Average Risk          9.6          7.13X Average Risk          15.0          11.0                      PSA     Status: None   Collection Time: 07/26/14  9:53 AM  Result Value Ref Range   PSA 0.50 0.10 - 4.00 ng/mL  LDL cholesterol, direct     Status: None   Collection Time: 07/26/14  9:53 AM  Result Value Ref Range   Direct LDL 86.0 mg/dL    Comment: Optimal:  <100 mg/dLNear or Above Optimal:  100-129 mg/dLBorderline High:  130-159 mg/dLHigh:  160-189 mg/dLVery High:  >190 mg/dL   Assessment/Plan: Visit for preventive health examination Labs reviewed again with patient. Copy printed and given to patient. Referral to GI for screening colonoscopy placed. Immunizations are up-to-date. Preventive schedule discussed with patient. Handout given in AVS.  Cerumen impaction Successfully removed via irrigation with resolution of symptoms.  Encounter for screening  colonoscopy Referral to GI placed for screening colonoscopy.  Essential hypertension BP stable. Asymptomatic. Continue current regimen. Patient has upcoming appointment with Cardiology. Follow-up 6 months.  Hypertriglyceridemia Currently on fenofibrate 145 after TGL at 1400s on labs. Is taking as directed without side effect. Has stopped alcohol and limiting sugars. Will recheck lipid panel in 4 weeks.

## 2014-08-03 NOTE — Assessment & Plan Note (Signed)
Currently on fenofibrate 145 after TGL at 1400s on labs. Is taking as directed without side effect. Has stopped alcohol and limiting sugars. Will recheck lipid panel in 4 weeks.

## 2014-08-03 NOTE — Assessment & Plan Note (Signed)
Labs reviewed again with patient. Copy printed and given to patient. Referral to GI for screening colonoscopy placed. Immunizations are up-to-date. Preventive schedule discussed with patient. Handout given in AVS.

## 2014-08-03 NOTE — Assessment & Plan Note (Signed)
Referral to GI placed for screening colonoscopy.  

## 2014-08-03 NOTE — Progress Notes (Signed)
Pre visit review using our clinic review tool, if applicable. No additional management support is needed unless otherwise documented below in the visit note. 

## 2014-08-03 NOTE — Assessment & Plan Note (Signed)
BP stable. Asymptomatic. Continue current regimen. Patient has upcoming appointment with Cardiology. Follow-up 6 months.

## 2014-09-02 ENCOUNTER — Other Ambulatory Visit: Payer: BLUE CROSS/BLUE SHIELD

## 2014-09-02 ENCOUNTER — Other Ambulatory Visit (INDEPENDENT_AMBULATORY_CARE_PROVIDER_SITE_OTHER): Payer: BLUE CROSS/BLUE SHIELD

## 2014-09-02 DIAGNOSIS — E781 Pure hyperglyceridemia: Secondary | ICD-10-CM | POA: Diagnosis not present

## 2014-09-02 LAB — LIPID PANEL
CHOLESTEROL: 210 mg/dL — AB (ref 0–200)
HDL: 35.3 mg/dL — AB (ref >39.00)
LDL Cholesterol: 137 mg/dL — ABNORMAL HIGH (ref 0–99)
NonHDL: 175.12
TRIGLYCERIDES: 192 mg/dL — AB (ref 0.0–149.0)
Total CHOL/HDL Ratio: 6
VLDL: 38.4 mg/dL (ref 0.0–40.0)

## 2014-09-15 ENCOUNTER — Ambulatory Visit (INDEPENDENT_AMBULATORY_CARE_PROVIDER_SITE_OTHER): Payer: BLUE CROSS/BLUE SHIELD | Admitting: Internal Medicine

## 2014-09-15 ENCOUNTER — Encounter: Payer: Self-pay | Admitting: Internal Medicine

## 2014-09-15 VITALS — BP 144/84 | HR 57 | Ht 71.0 in | Wt 248.8 lb

## 2014-09-15 DIAGNOSIS — I10 Essential (primary) hypertension: Secondary | ICD-10-CM

## 2014-09-15 DIAGNOSIS — E785 Hyperlipidemia, unspecified: Secondary | ICD-10-CM | POA: Diagnosis not present

## 2014-09-15 DIAGNOSIS — Z79899 Other long term (current) drug therapy: Secondary | ICD-10-CM

## 2014-09-15 DIAGNOSIS — I2583 Coronary atherosclerosis due to lipid rich plaque: Secondary | ICD-10-CM

## 2014-09-15 DIAGNOSIS — I2510 Atherosclerotic heart disease of native coronary artery without angina pectoris: Secondary | ICD-10-CM | POA: Diagnosis not present

## 2014-09-15 DIAGNOSIS — E781 Pure hyperglyceridemia: Secondary | ICD-10-CM

## 2014-09-15 MED ORDER — ROSUVASTATIN CALCIUM 20 MG PO TABS
20.0000 mg | ORAL_TABLET | Freq: Every day | ORAL | Status: DC
Start: 2014-09-15 — End: 2015-09-22

## 2014-09-15 NOTE — Patient Instructions (Addendum)
Your physician has recommended you make the following change in your medication: START crestor 20mg  once daily   Your physician recommends that you return for lab work end of NEXT WEEK (CK and CMET)  Your physician recommends that you return for lab work FASTING in 3 months.   Your physician recommends that you schedule a follow-up appointment in: 3 months.    Medical Records Release faxed to [facility] and Vascular (Dr. Deberah Pelton) @ [phone])

## 2014-09-16 ENCOUNTER — Encounter: Payer: Self-pay | Admitting: *Deleted

## 2014-09-16 NOTE — Progress Notes (Signed)
OFFICE NOTE  Chief Complaint:  Establish cardiologist  Primary Care Physician: Gregory Rio, PA-C  HPI:  Gregory Delacruz is a pleasant 21 rolled male who is establishing a cardiologist. He recently moved back to [place] however lived in [place] for about 12 years. In November 2014 he had acute onset chest pain and presented to [facility]. He had a an anterior ST elevation MI and underwent a Promus drug-eluting stent placement to the LAD. This was a Promus Premier 2.75 x 20 mm stent. He took aspirin and Plavix for 1 year and then drop back to aspirin therapy. He is also on atenolol and TriCor.  His cardiologist in [place] was Dr. Deberah Delacruz. EKG shows sinus bradycardia 57. He is currently asymptomatic denies any chest pain or worsening shortness of breath. He had a recent lipid profile performed on 09/02/2014 which shows total cholesterol 210, triglycerides 192, HDL 35 and LDL 137. Non-HDL cholesterol was noted at 175. For some reason his triglycerides were over 1400 one month ago, and thankfully this is improved on TriCor and with dietary changes. He is not currently on a statin and goal LDL cholesterol is less than 70.  PMHx:  Past Medical History  Diagnosis Date  . Hypertension   . Heart attack   . Hyperlipidemia     Past Surgical History  Procedure Laterality Date  . Coronary angioplasty with stent placement  12/02/2012    stent to LAD   . Biceps tendon repair  1997    FAMHx:  Family History  Problem Relation Age of Onset  . Diabetes Mother   . Hypertension Mother   . Cancer Father 89    prostate   . Heart disease Father 56    triple bypass   . Diabetes Father   . Hypertension Father     SOCHx:   reports that he has never smoked. He has never used smokeless tobacco. He reports that he drinks about 0.6 - 1.2 oz of alcohol per week. He reports that he does not use illicit drugs.  ALLERGIES:  Allergies  Allergen Reactions  .  Shellfish Allergy Swelling  . Penicillins Itching    ROS: A comprehensive review of systems was negative.  HOME MEDS: Current Outpatient Prescriptions  Medication Sig Dispense Refill  . aspirin 81 MG tablet Take 81 mg by mouth daily.    Marland Kitchen atenolol (TENORMIN) 25 MG tablet Take 1 tablet (25 mg total) by mouth 2 (two) times daily. 90 tablet 1  . EPINEPHrine 0.3 mg/0.3 mL IJ SOAJ injection Inject 0.3 mLs (0.3 mg total) into the muscle once. 1 Device 0  . fenofibrate (TRICOR) 145 MG tablet Take 1 tablet (145 mg total) by mouth daily. 30 tablet 2  . tiZANidine (ZANAFLEX) 2 MG tablet as needed.  0  . rosuvastatin (CRESTOR) 20 MG tablet Take 1 tablet (20 mg total) by mouth daily. 90 tablet 3   No current facility-administered medications for this visit.    LABS/IMAGING: No results found for this or any previous visit (from the past 48 hour(s)). No results found.  WEIGHTS: Wt Readings from Last 3 Encounters:  09/15/14 248 lb 12.8 oz (112.855 kg)  08/03/14 249 lb 12.8 oz (113.309 kg)  07/26/14 251 lb 9.6 oz (114.125 kg)    VITALS: BP 144/84 mmHg  Pulse 57  Ht 5\' 11"  (1.803 m)  Wt 248 lb 12.8 oz (112.855 kg)  BMI 34.72 kg/m2  EXAM: General appearance: alert and  no distress Neck: no carotid bruit and no JVD Lungs: clear to auscultation bilaterally Heart: regular rate and rhythm, S1, S2 normal, no murmur, click, rub or gallop Abdomen: soft, non-tender; bowel sounds normal; no masses,  no organomegaly Extremities: extremities normal, atraumatic, no cyanosis or edema Pulses: 2+ and symmetric Skin: Skin color, texture, turgor normal. No rashes or lesions Neurologic: Grossly normal psych: Pleasant  EKG: Sinus bradycardia at 57  ASSESSMENT: 1. CAD s/p anterior STEMI in 11/2012 - s/p Promus Premier DES 2.75 x 20 mm in the proximal LAD 2. Hypertension 3. Dyslipidemia  PLAN: 1.   Mr. Gregory Delacruz has done well since his stent in November 2014. He denies any chest pain or shortness of  breath. Blood pressure is at goal. His cholesterol however is not at goal with an LDL less than 70. I think he benefit from being on statin therapy in addition to his fiber 8. He's aware that there may be increased risk for myopathy and I recommend a repeat metabolic profile, liver enzymes and a CK neck week. We'll plan to start him on Lipitor 40 mg daily.  Follow-up in 3 months.  Pixie Casino, MD, [facility] Attending Cardiologist [place] 09/16/2014, 11:04 AM

## 2014-10-03 ENCOUNTER — Telehealth: Payer: Self-pay | Admitting: Internal Medicine

## 2014-10-03 NOTE — Telephone Encounter (Signed)
Received records from [facility] on patient for upcoming appointment with Dr. Debara Pickett on 12/12/2014.  Records given to [facility].  cbr

## 2014-10-14 ENCOUNTER — Encounter: Payer: BLUE CROSS/BLUE SHIELD | Admitting: Gastroenterology

## 2014-10-25 ENCOUNTER — Other Ambulatory Visit: Payer: Self-pay | Admitting: Physician Assistant

## 2014-12-12 ENCOUNTER — Encounter: Payer: Self-pay | Admitting: Internal Medicine

## 2014-12-12 ENCOUNTER — Ambulatory Visit (INDEPENDENT_AMBULATORY_CARE_PROVIDER_SITE_OTHER): Payer: BLUE CROSS/BLUE SHIELD | Admitting: Internal Medicine

## 2014-12-12 VITALS — BP 132/80 | HR 80 | Ht 71.0 in | Wt 249.8 lb

## 2014-12-12 DIAGNOSIS — N63 Unspecified lump in breast: Secondary | ICD-10-CM | POA: Diagnosis not present

## 2014-12-12 DIAGNOSIS — I2510 Atherosclerotic heart disease of native coronary artery without angina pectoris: Secondary | ICD-10-CM | POA: Diagnosis not present

## 2014-12-12 DIAGNOSIS — I10 Essential (primary) hypertension: Secondary | ICD-10-CM | POA: Diagnosis not present

## 2014-12-12 DIAGNOSIS — N6320 Unspecified lump in the left breast, unspecified quadrant: Secondary | ICD-10-CM

## 2014-12-12 DIAGNOSIS — I2583 Coronary atherosclerosis due to lipid rich plaque: Secondary | ICD-10-CM

## 2014-12-12 DIAGNOSIS — N6324 Unspecified lump in the left breast, lower inner quadrant: Secondary | ICD-10-CM

## 2014-12-12 DIAGNOSIS — E781 Pure hyperglyceridemia: Secondary | ICD-10-CM

## 2014-12-12 NOTE — Progress Notes (Signed)
OFFICE NOTE  Chief Complaint:  Routine follow-up  Primary Care Physician: Leeanne Rio, PA-C  HPI:  Gregory Delacruz is a pleasant 65 rolled male who is establishing a cardiologist. He recently moved back to [place] however lived in [place] for about 12 years. In November 2014 he had acute onset chest pain and presented to [facility]. He had a an anterior ST elevation MI and underwent a Promus drug-eluting stent placement to the LAD. This was a Promus Premier 2.75 x 20 mm stent. He took aspirin and Plavix for 1 year and then drop back to aspirin therapy. He is also on atenolol and TriCor.  His cardiologist in [place] was Dr. Deberah Pelton. EKG shows sinus bradycardia 57. He is currently asymptomatic denies any chest pain or worsening shortness of breath. He had a recent lipid profile performed on 09/02/2014 which shows total cholesterol 210, triglycerides 192, HDL 35 and LDL 137. Non-HDL cholesterol was noted at 175. For some reason his triglycerides were over 1400 one month ago, and thankfully this is improved on TriCor and with dietary changes. He is not currently on a statin and goal LDL cholesterol is less than 70.  Gregory Delacruz returns today for follow-up. He denies any chest pain or worsening shortness of breath. Cholesterol remains elevated and we recently started him on Lipitor in addition to [place]. He is due for repeat lipid profile. He is also complaining today of a "knot in his chest". He describes a bump or not in the left breast at the 7 to 8:00 position from the left nipple. He says is not painful and does not feel that is changed in size. There is no history of early breast cancer in the family.  PMHx:  Past Medical History  Diagnosis Date  . Hypertension   . Heart attack ([place])   . Hyperlipidemia     Past Surgical History  Procedure Laterality Date  . Coronary angioplasty with stent placement  12/02/2012    stent to LAD   . Biceps tendon  repair  1997    FAMHx:  Family History  Problem Relation Age of Onset  . Diabetes Mother   . Hypertension Mother   . Cancer Father 18    prostate   . Heart disease Father 73    triple bypass   . Diabetes Father   . Hypertension Father   . Stroke Maternal Grandmother   . Heart attack Paternal Grandmother   . Cancer Paternal Grandfather     SOCHx:   reports that he has never smoked. He has never used smokeless tobacco. He reports that he drinks about 0.6 - 1.2 oz of alcohol per week. He reports that he does not use illicit drugs.  ALLERGIES:  Allergies  Allergen Reactions  . Shellfish Allergy Swelling  . Penicillins Itching    ROS: A comprehensive review of systems was negative.  HOME MEDS: Current Outpatient Prescriptions  Medication Sig Dispense Refill  . aspirin 81 MG tablet Take 81 mg by mouth daily.    Marland Kitchen atenolol (TENORMIN) 25 MG tablet Take 1 tablet (25 mg total) by mouth 2 (two) times daily. 90 tablet 1  . EPINEPHrine 0.3 mg/0.3 mL IJ SOAJ injection Inject 0.3 mLs (0.3 mg total) into the muscle once. 1 Device 0  . fenofibrate (TRICOR) 145 MG tablet Take 145 mg by mouth every other day.    . rosuvastatin (CRESTOR) 20 MG tablet Take 1 tablet (20 mg total) by  mouth daily. 90 tablet 3  . tiZANidine (ZANAFLEX) 2 MG tablet as needed.  0   No current facility-administered medications for this visit.    LABS/IMAGING: No results found for this or any previous visit (from the past 48 hour(s)). No results found.  WEIGHTS: Wt Readings from Last 3 Encounters:  12/12/14 249 lb 12.8 oz (113.309 kg)  09/15/14 248 lb 12.8 oz (112.855 kg)  08/03/14 249 lb 12.8 oz (113.309 kg)    VITALS: BP 132/80 mmHg  Pulse 80  Ht 5\' 11"  (1.803 m)  Wt 249 lb 12.8 oz (113.309 kg)  BMI 34.86 kg/m2  EXAM: General appearance: alert and no distress Neck: no carotid bruit and no JVD Lungs: clear to auscultation bilaterally Heart: regular rate and rhythm, S1, S2 normal, no murmur,  click, rub or gallop Abdomen: soft, non-tender; bowel sounds normal; no masses,  no organomegaly Extremities: extremities normal, atraumatic, no cyanosis or edema and Half centimeter round lump in the left chest wall Pulses: 2+ and symmetric Skin: Skin color, texture, turgor normal. No rashes or lesions Neurologic: Grossly normal psych: Pleasant  EKG: Normal sinus rhythm at 80  ASSESSMENT: 1. CAD s/p anterior STEMI in 11/2012 - s/p Promus Premier DES 2.75 x 20 mm in the proximal LAD 2. Hypertension 3. Dyslipidemia 4. Left breast lump  PLAN: 1.   Gregory Delacruz seems stable from a cardiovascular standpoint. He is due for recheck of his lipid profile since he's been taking Crestor, but is not having side effects. In addition today, he reported a left breast lump which she wishes to have further evaluated. I don't think is enough breast tissue for mammogram therefore I'll order an ultrasound evaluation of it. Plan to see him back in 6 months or sooner as necessary.  Pixie Casino, MD, [facility] Attending Cardiologist [place] C H[place] 12/12/2014, 5:55 PM

## 2014-12-12 NOTE — Patient Instructions (Addendum)
Dr. Debara Pickett has ordered an ultrasound of your chest @ [address]  Your physician recommends that you return for lab work FASTING  Your physician wants you to follow-up in: 6 months with Dr. Debara Pickett. You will receive a reminder letter in the mail two months in advance. If you don't receive a letter, please call our office to schedule the follow-up appointment.

## 2014-12-14 ENCOUNTER — Other Ambulatory Visit: Payer: Self-pay | Admitting: Internal Medicine

## 2014-12-14 DIAGNOSIS — N6320 Unspecified lump in the left breast, unspecified quadrant: Secondary | ICD-10-CM

## 2014-12-19 ENCOUNTER — Emergency Department (HOSPITAL_BASED_OUTPATIENT_CLINIC_OR_DEPARTMENT_OTHER): Payer: BLUE CROSS/BLUE SHIELD

## 2014-12-19 ENCOUNTER — Encounter (HOSPITAL_BASED_OUTPATIENT_CLINIC_OR_DEPARTMENT_OTHER): Payer: Self-pay | Admitting: Emergency Medicine

## 2014-12-19 ENCOUNTER — Observation Stay (HOSPITAL_BASED_OUTPATIENT_CLINIC_OR_DEPARTMENT_OTHER)
Admission: EM | Admit: 2014-12-19 | Discharge: 2014-12-20 | Disposition: A | Discharge status: Home / Self Care | Payer: BLUE CROSS/BLUE SHIELD | Attending: Cardiology | Admitting: Cardiology

## 2014-12-19 DIAGNOSIS — I252 Old myocardial infarction: Secondary | ICD-10-CM | POA: Insufficient documentation

## 2014-12-19 DIAGNOSIS — I10 Essential (primary) hypertension: Secondary | ICD-10-CM | POA: Insufficient documentation

## 2014-12-19 DIAGNOSIS — I2510 Atherosclerotic heart disease of native coronary artery without angina pectoris: Secondary | ICD-10-CM | POA: Insufficient documentation

## 2014-12-19 DIAGNOSIS — R079 Chest pain, unspecified: Secondary | ICD-10-CM | POA: Diagnosis not present

## 2014-12-19 DIAGNOSIS — E781 Pure hyperglyceridemia: Secondary | ICD-10-CM | POA: Insufficient documentation

## 2014-12-19 DIAGNOSIS — Z955 Presence of coronary angioplasty implant and graft: Secondary | ICD-10-CM | POA: Insufficient documentation

## 2014-12-19 DIAGNOSIS — E785 Hyperlipidemia, unspecified: Secondary | ICD-10-CM | POA: Diagnosis not present

## 2014-12-19 DIAGNOSIS — N63 Unspecified lump in breast: Secondary | ICD-10-CM | POA: Insufficient documentation

## 2014-12-19 DIAGNOSIS — Z79899 Other long term (current) drug therapy: Secondary | ICD-10-CM | POA: Diagnosis not present

## 2014-12-19 DIAGNOSIS — N6324 Unspecified lump in the left breast, lower inner quadrant: Secondary | ICD-10-CM | POA: Diagnosis present

## 2014-12-19 DIAGNOSIS — Z7982 Long term (current) use of aspirin: Secondary | ICD-10-CM | POA: Insufficient documentation

## 2014-12-19 HISTORY — DX: Pure hyperglyceridemia: E78.1

## 2014-12-19 HISTORY — DX: Atherosclerotic heart disease of native coronary artery without angina pectoris: I25.10

## 2014-12-19 HISTORY — DX: Unspecified lump in unspecified breast: N63.0

## 2014-12-19 LAB — BASIC METABOLIC PANEL
Anion gap: 8 (ref 5–15)
BUN: 12 mg/dL (ref 6–20)
CALCIUM: 9.4 mg/dL (ref 8.9–10.3)
CO2: 22 mmol/L (ref 22–32)
Chloride: 108 mmol/L (ref 101–111)
Creatinine, Ser: 0.92 mg/dL (ref 0.61–1.24)
GFR calc Af Amer: >60 mL/min (ref >60)
GLUCOSE: 102 mg/dL — AB (ref 65–99)
Potassium: 4.1 mmol/L (ref 3.5–5.1)
SODIUM: 138 mmol/L (ref 135–145)

## 2014-12-19 LAB — TROPONIN I: Troponin I: <0.03 ng/mL (ref <0.031)

## 2014-12-19 LAB — CBC WITH DIFFERENTIAL/PLATELET
BASOS ABS: 0 10*3/uL (ref 0.0–0.1)
Basophils Relative: 0 %
EOS ABS: 0.4 10*3/uL (ref 0.0–0.7)
EOS PCT: 6 %
HCT: 47.8 % (ref 39.0–52.0)
Hemoglobin: 16.3 g/dL (ref 13.0–17.0)
LYMPHS ABS: 1.8 10*3/uL (ref 0.7–4.0)
LYMPHS PCT: 29 %
MCH: 30.6 pg (ref 26.0–34.0)
MCHC: 34.1 g/dL (ref 30.0–36.0)
MCV: 89.7 fL (ref 78.0–100.0)
MONO ABS: 0.7 10*3/uL (ref 0.1–1.0)
Monocytes Relative: 11 %
Neutro Abs: 3.3 10*3/uL (ref 1.7–7.7)
Neutrophils Relative %: 54 %
PLATELETS: 178 10*3/uL (ref 150–400)
RBC: 5.33 MIL/uL (ref 4.22–5.81)
RDW: 12.6 % (ref 11.5–15.5)
WBC: 6.2 10*3/uL (ref 4.0–10.5)

## 2014-12-19 LAB — HEPATIC FUNCTION PANEL
ALBUMIN: 3.4 g/dL — AB (ref 3.5–5.0)
ALT: 16 U/L — AB (ref 17–63)
AST: 23 U/L (ref 15–41)
Alkaline Phosphatase: 43 U/L (ref 38–126)
Bilirubin, Direct: 0.2 mg/dL (ref 0.1–0.5)
Indirect Bilirubin: 0.5 mg/dL (ref 0.3–0.9)
TOTAL PROTEIN: 5.9 g/dL — AB (ref 6.5–8.1)
Total Bilirubin: 0.7 mg/dL (ref 0.3–1.2)

## 2014-12-19 MED ORDER — NITROGLYCERIN 2 % TD OINT
1.0000 [in_us] | TOPICAL_OINTMENT | Freq: Once | TRANSDERMAL | Status: AC
  Administered 2014-12-19: 1 [in_us] via TOPICAL
  Filled 2014-12-19: qty 1

## 2014-12-19 MED ORDER — ROSUVASTATIN CALCIUM 40 MG PO TABS
20.0000 mg | ORAL_TABLET | Freq: Every day | ORAL | Status: DC
  Administered 2014-12-20: 20 mg via ORAL
  Filled 2014-12-19: qty 1

## 2014-12-19 MED ORDER — NITROGLYCERIN 0.2 MG/HR TD PT24
0.2000 mg | MEDICATED_PATCH | Freq: Every day | TRANSDERMAL | Status: DC
  Filled 2014-12-19 (×2): qty 1

## 2014-12-19 MED ORDER — ACETAMINOPHEN 325 MG PO TABS
650.0000 mg | ORAL_TABLET | ORAL | Status: DC | PRN
  Administered 2014-12-19: 650 mg via ORAL
  Filled 2014-12-19: qty 2

## 2014-12-19 MED ORDER — ASPIRIN 81 MG PO CHEW
243.0000 mg | CHEWABLE_TABLET | Freq: Once | ORAL | Status: AC
  Administered 2014-12-19: 243 mg via ORAL
  Filled 2014-12-19: qty 3

## 2014-12-19 MED ORDER — ATENOLOL 25 MG PO TABS
25.0000 mg | ORAL_TABLET | Freq: Two times a day (BID) | ORAL | Status: DC
Start: 2014-12-19 — End: 2014-12-20
  Administered 2014-12-19 – 2014-12-20 (×2): 25 mg via ORAL
  Filled 2014-12-19 (×2): qty 1

## 2014-12-19 MED ORDER — "NITROGLYCERIN NICU 2% OINTMENT ": TOPICAL_OINTMENT | Freq: Two times a day (BID) | TRANSDERMAL | Status: DC

## 2014-12-19 MED ORDER — ONDANSETRON HCL 4 MG/2ML IJ SOLN: 4.0000 mg | Freq: Four times a day (QID) | INTRAMUSCULAR | Status: DC | PRN

## 2014-12-19 MED ORDER — HEPARIN BOLUS VIA INFUSION
4000.0000 [IU] | Freq: Once | INTRAVENOUS | Status: AC
  Administered 2014-12-19: 4000 [IU] via INTRAVENOUS
  Filled 2014-12-19: qty 4000

## 2014-12-19 MED ORDER — ASPIRIN EC 81 MG PO TBEC
81.0000 mg | DELAYED_RELEASE_TABLET | Freq: Every day | ORAL | Status: DC
  Administered 2014-12-20: 81 mg via ORAL
  Filled 2014-12-19 (×2): qty 1

## 2014-12-19 MED ORDER — FENOFIBRATE 160 MG PO TABS
160.0000 mg | ORAL_TABLET | Freq: Every day | ORAL | Status: DC
  Administered 2014-12-20: 160 mg via ORAL
  Filled 2014-12-19: qty 1

## 2014-12-19 MED ORDER — HEPARIN (PORCINE) IN NACL 100-0.45 UNIT/ML-% IJ SOLN
1300.0000 [IU]/h | INTRAMUSCULAR | Status: DC
  Administered 2014-12-19 – 2014-12-20 (×2): 1300 [IU]/h via INTRAVENOUS
  Filled 2014-12-19 (×2): qty 250

## 2014-12-19 MED ORDER — NITROGLYCERIN 0.4 MG SL SUBL: 0.4000 mg | SUBLINGUAL_TABLET | SUBLINGUAL | Status: DC | PRN

## 2014-12-19 NOTE — Progress Notes (Signed)
ANTICOAGULATION CONSULT NOTE - Initial Consult  Pharmacy Consult for heparin Indication: chest pain/ACS  Allergies  Allergen Reactions  . Shellfish Allergy Swelling  . Penicillins Itching    Patient Measurements: Height: 5\' 11"  (180.3 cm) Weight: 240 lb (108.863 kg) IBW/kg (Calculated) : 75.3 Heparin Dosing Weight: 98.5 kg  Vital Signs: Temp: 97.4 F (36.3 C) (11/28 1429) Temp Source: Oral (11/28 1429) BP: 128/80 mmHg (11/28 1429) Pulse Rate: 61 (11/28 1429)  Labs:  Recent Labs  12/19/14 1000 12/19/14 1029  HGB  --  16.3  HCT  --  47.8  PLT  --  178  CREATININE  --  0.92  TROPONINI <0.03  --     Estimated Creatinine Clearance: 111.1 mL/min (by C-G formula based on Cr of 0.92).   Medical History: Past Medical History  Diagnosis Date  . Hypertension   . Heart attack ([place])   . Hyperlipidemia   . Coronary artery disease     Medications:  Prescriptions prior to admission  Medication Sig Dispense Refill Last Dose  . aspirin 81 MG tablet Take 81 mg by mouth daily.   12/19/2014 at Unknown time  . atenolol (TENORMIN) 25 MG tablet Take 1 tablet (25 mg total) by mouth 2 (two) times daily. 90 tablet 1 12/19/2014 at Unknown time  . fenofibrate (TRICOR) 145 MG tablet Take 145 mg by mouth every other day.   12/19/2014 at Unknown time  . rosuvastatin (CRESTOR) 20 MG tablet Take 1 tablet (20 mg total) by mouth daily. 90 tablet 3 12/19/2014 at Unknown time  . tiZANidine (ZANAFLEX) 2 MG tablet as needed.  0 Past Week at Unknown time  . EPINEPHrine 0.3 mg/0.3 mL IJ SOAJ injection Inject 0.3 mLs (0.3 mg total) into the muscle once. 1 Device 0 More than a month at Unknown time    Assessment: 59 yo M transferred from [facility] to [facility]  Pharmacy consulted to dose heparin for chest pain/ACS/STEMI.  Wt 108.9 kg, HDW 98,5 kg; CBC WNL.  Creat WNL.  Not on blood thinners PTA.   Goal of Therapy:  Heparin level 0.3-0.7 units/ml Monitor platelets by anticoagulation protocol: Yes   Plan:   Give 4000 units bolus x 1 Start heparin infusion at 1300 units/hr Check anti-Xa level in 6 hours and daily while on heparin Continue to monitor H&H and platelets  Eudelia Bunch, Pharm.D. BP:[phone] 12/19/2014 4:44 PM

## 2014-12-19 NOTE — ED Notes (Signed)
Pt care transferred to [facility] staff at bedside. Pt is alert and smiling in nad, denies any cp, or any other c/o.

## 2014-12-19 NOTE — ED Notes (Signed)
Pt declines gown or to remove his pants. "Hopefully this is just a quick check and I can get out of here..." pt assisted to use his cell phone to call his wife.

## 2014-12-19 NOTE — ED Notes (Signed)
Pt updated that [facility] in en route to ER for transport. Denies any c/o.

## 2014-12-19 NOTE — Progress Notes (Signed)
Patient resting in bed. Report taken from EMT and Amy at [facility]. Telemetry is on and EKG has been completed. Patient instructed on safety guidelines.

## 2014-12-19 NOTE — Progress Notes (Signed)
Patient has refused the nitroglycerin patch and has refused a new one. He has been educated on the removal. Cammy Brochure

## 2014-12-19 NOTE — H&P (Signed)
Patient ID: Gregory Delacruz MRN: [id number], DOB/AGE: 05/10/1955   Admit date: 12/19/2014   Primary Physician: Leeanne Rio, PA-C Primary Cardiologist: Dr. Debara Pickett  Pt. Profile:  Gregory Delacruz is a 59 y.o. male with a history of CAD, HTN and HL who admitted directly to [facility] from [place]  for evaluation of chest pain.   HPI: As above. He recently moved back to [place] however lived in [place] for about 12 years. In November 2014 he had acute onset chest pain and presented to [facility]. He had a an anterior ST elevation MI and underwent a Promus drug-eluting stent placement to the LAD. This was a Promus Premier 2.75 x 20 mm stent. He took aspirin and Plavix for 1 year and then drop back to aspirin therapy. He is also on atenolol and TriCor.Recently established care with Dr. Debara Pickett, last seen 12/12/14. At that time he was complaining of bump/knot in the left breast at the 7 to 8:00 position from the left nipple. He says is not painful and does not feel that is changed in size. There is no history of early breast cancer in the family. Pending ultrasound evaluation of it for further evaluation.  Recently his triglyceride was over thousand that was improved on TriCor with dietary changes. He is due to get his repeat lipid profile.   He was in his usual state of there up until this morning around 8:30 when he had a sudden episode of vomiting. He ate a banana during morning prior to vomit. Then he developed a left-sided chest pain that he described as a sharp. The pain persisted and he was in his way to his job, while driving he suddenly got dizziness and again developed a sharp pain. This laid him to present to [facility] as this is in his way to his job. He states that his chest pain lasted for 2-3 hours and is off after nitroglycerin patch. This episode is similar to his cardiac pain, however, less severe.  He times prior to [facility] for further  evaluation. Troponin x 1 negative. EKG nonischemic. Lytes normal. CXR clear. He does complaining of another knot in L bump at 3 O'clock position. He is also having left-sided back pain. Currently denies any chest pain.  The patient denies  fever,  palpitations, shortness of breath, orthopnea, PND, dizziness, syncope, cough, congestion, abdominal pain, hematochezia, melena, lower extremity edema.   Problem List  Past Medical History  Diagnosis Date  . Hypertension   . Heart attack ([place])   . Hyperlipidemia   . Coronary artery disease     Past Surgical History  Procedure Laterality Date  . Coronary angioplasty with stent placement  12/02/2012    stent to LAD   . Biceps tendon repair  1997     Allergies  Allergies  Allergen Reactions  . Shellfish Allergy Swelling  . Penicillins Itching     Home Medications  Prior to Admission medications   Medication Sig Start Date End Date Taking? Authorizing Provider  aspirin 81 MG tablet Take 81 mg by mouth daily.   Yes Historical Provider, MD  atenolol (TENORMIN) 25 MG tablet Take 1 tablet (25 mg total) by mouth 2 (two) times daily. 07/26/14  Yes Brunetta Jeans, PA-C  fenofibrate (TRICOR) 145 MG tablet Take 145 mg by mouth every other day.   Yes Historical Provider, MD  rosuvastatin (CRESTOR) 20 MG tablet Take 1 tablet (20 mg total) by  mouth daily. 09/15/14  Yes Pixie Casino, MD  tiZANidine (ZANAFLEX) 2 MG tablet as needed. 07/26/14  Yes Historical Provider, MD  EPINEPHrine 0.3 mg/0.3 mL IJ SOAJ injection Inject 0.3 mLs (0.3 mg total) into the muscle once. 07/26/14   Brunetta Jeans, PA-C    Family History  Family History  Problem Relation Age of Onset  . Diabetes Mother   . Hypertension Mother   . Cancer Father 49    prostate   . Heart disease Father 66    triple bypass   . Diabetes Father   . Hypertension Father   . Stroke Maternal Grandmother   . Heart attack Paternal Grandmother   . Cancer Paternal Grandfather    Family  Status  Relation Status Death Age  . Mother Alive   . Father Alive   . Sister Alive     Social History  Social History   Social History  . Marital Status: Married    Spouse Name: N/A  . Number of Children: 1  . 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 Smoking status: Never Smoker   . Smokeless tobacco: Never Used  . Alcohol Use: 0.6 - 1.2 oz/week    1-2 Standard drinks or equivalent per week     Comment: occasional  . Drug Use: No  . Sexual Activity: Not Currently   Other Topics Concern  . Not on file   Social History Narrative     All other systems reviewed and are otherwise negative except as noted above.  Physical Exam  Blood pressure 128/80, pulse 61, temperature 97.4 F (36.3 C), temperature source Oral, resp. rate 20, height 5\' 11"  (1.803 m), weight 240 lb (108.863 kg), SpO2 99 %.  General: Pleasant, obese male in NAD Psych: Normal affect. Neuro: Alert and oriented X 3. Moves all extremities spontaneously. HEENT: Normal  Neck: Supple without bruits or JVD. Lungs:  Resp regular and unlabored, CTA. Half centimeter round lump in the left chest wall at 7 to 8O'clock position and very tiny 1/4 centimeter at 3 O'clock position. L mid low back pain reproducible with palpation.  Heart: RRR no s3, s4, or murmurs. Abdomen: Soft, non-tender, non-distended, BS + x 4.  Extremities: No clubbing, cyanosis or edema. DP/PT/Radials 2+ and equal bilaterally.  Labs   Recent Labs  12/19/14 1000  TROPONINI <0.03   Lab Results  Component Value Date   WBC 6.2 12/19/2014   HGB 16.3 12/19/2014   HCT 47.8 12/19/2014   MCV 89.7 12/19/2014   PLT 178 12/19/2014    Recent Labs Lab 12/19/14 1029  NA 138  K 4.1  CL 108  CO2 22  BUN 12  CREATININE 0.92  CALCIUM 9.4  GLUCOSE 102*   Lab Results  Component Value Date   CHOL 210* 09/02/2014   HDL 35.30* 09/02/2014   LDLCALC 137* 09/02/2014    TRIG 192.0* 09/02/2014   No results found for: DDIMER   Radiology/Studies  Dg Chest 2 View  12/19/2014  CLINICAL DATA:  Acute onset chest pain, dizziness. EXAM: CHEST  2 VIEW COMPARISON:  None. FINDINGS: Trachea is midline. Heart size normal. Lungs are clear. No pleural fluid. IMPRESSION: No acute findings. Electronically Signed   By: Lorin Picket M.D.   On: 12/19/2014 10:29    ECG  Vent. rate 72 BPM PR interval 176 ms QRS duration 106 ms QT/QTc  420/459 ms P-R-T axes 50 -39 43  ASSESSMENT AND PLAN  1. Chest pain with high risk for cardiac etiology  - His chest pain is similar to previous cardiac pain. However, less severe. Chest pain improved on nitroglycerin patch. Troponin x 1 negative. EKG nonischemic. -Cycle troponin and serial EKG. Myoview tomorrow. Continue nitro patch and start IV heparin. Reassess in AM.  - Continue atenolol 25 MG twice a day, fenofibrate 145 MG and Crestor 20 MG.  2. CAD - s/p Promus drug-eluting stent placement to the LAD.   3. HL - Recently his triglyceride was over thousand that was improved on TriCor with dietary changes. He is due to get his repeat lipid profile.  - Will get lipid profile and LFT.  4. L breast bump/knot - Complained of bump/knot in the left breast at the 7 to 8:00 position from the left nipple when seen Dr. Debara Pickett. He says is not painful and does not feel that is changed in size. There is no history of early breast cancer in the family. Pending ultrasound evaluation of it for further evaluation.  - He does complaining of another knot in L bump at 3 O'clock position.  - Ultrasound as outpatient.    5. HTN - stable and well controlled. Continue current regimen.  SignedLeanor Kail, PA-C 12/19/2014, 3:48 PM Pager [phone]

## 2014-12-19 NOTE — ED Notes (Signed)
Pt reports acute onset of chest pain, associated with dizziness

## 2014-12-19 NOTE — ED Provider Notes (Signed)
CSN: [id number]     Arrival date & time 12/19/14  0944 History   First MD Initiated Contact with Patient 12/19/14 [phone]     Chief Complaint  Patient presents with  . Chest Pain     (Consider location/radiation/quality/duration/timing/severity/associated sxs/prior Treatment) Patient is a 59 y.o. male presenting with chest pain. The history is provided by the patient.  Chest Pain Pain location:  Substernal area Pain quality: sharp and shooting   Pain radiates to:  Does not radiate Pain radiates to the back: no   Pain severity:  Severe Onset quality:  Sudden Duration:  1 hour Timing:  Rare Progression:  Resolved Chronicity:  Recurrent Relieved by:  Nothing Worsened by:  Nothing tried Ineffective treatments:  None tried Associated symptoms: no abdominal pain, no fever, no headache, no palpitations, no shortness of breath and not vomiting   Risk factors: coronary artery disease, high cholesterol, hypertension, male sex and obesity   Risk factors: no diabetes mellitus    59 yo M with a chief complaint chest pain. Patient has a history of a prior MI 2 years ago was a STEMI was taken to [facility] where he had a stent placed. Patient feels like the pain felt just like that but resolved on its own. Patient did not take any medication for this. Patient does not like Dr. [facility] is in a headache. Patient denies exertional component. Patient did have shortness of breath diaphoresis and threw up a couple times. Patient denies any abdominal tenderness. Denies any radiation of pain. Denies pleuritic component. Denies cough congestion fevers. Denies leg swelling recent surgery history of cancer testosterone use, hemoptysis.    Past Medical History  Diagnosis Date  . Hypertension   . Heart attack ([place])   . Hyperlipidemia    Past Surgical History  Procedure Laterality Date  . Coronary angioplasty with stent placement  12/02/2012    stent to LAD   . Biceps tendon repair  1997    Family History  Problem Relation Age of Onset  . Diabetes Mother   . Hypertension Mother   . Cancer Father 25    prostate   . Heart disease Father 33    triple bypass   . Diabetes Father   . Hypertension Father   . Stroke Maternal Grandmother   . Heart attack Paternal Grandmother   . Cancer Paternal Grandfather    Social History  Substance Use Topics  . Smoking status: Never Smoker   . Smokeless tobacco: Never Used  . Alcohol Use: 0.6 - 1.2 oz/week    1-2 Standard drinks or equivalent per week     Comment: occasional    Review of Systems  Constitutional: Negative for fever and chills.  HENT: Negative for congestion and facial swelling.   Eyes: Negative for discharge and visual disturbance.  Respiratory: Negative for shortness of breath.   Cardiovascular: Positive for chest pain. Negative for palpitations.  Gastrointestinal: Negative for vomiting, abdominal pain and diarrhea.  Musculoskeletal: Negative for myalgias and arthralgias.  Skin: Negative for color change and rash.  Neurological: Negative for tremors, syncope and headaches.  Psychiatric/Behavioral: Negative for confusion and dysphoric mood.      Allergies  Shellfish allergy and Penicillins  Home Medications   Prior to Admission medications   Medication Sig Start Date End Date Taking? Authorizing Provider  aspirin 81 MG tablet Take 81 mg by mouth daily.   Yes Historical Provider, MD  atenolol (TENORMIN) 25 MG tablet Take 1 tablet (25 mg  total) by mouth 2 (two) times daily. 07/26/14  Yes Brunetta Jeans, PA-C  fenofibrate (TRICOR) 145 MG tablet Take 145 mg by mouth every other day.   Yes Historical Provider, MD  rosuvastatin (CRESTOR) 20 MG tablet Take 1 tablet (20 mg total) by mouth daily. 09/15/14  Yes Pixie Casino, MD  tiZANidine (ZANAFLEX) 2 MG tablet as needed. 07/26/14  Yes Historical Provider, MD  EPINEPHrine 0.3 mg/0.3 mL IJ SOAJ injection Inject 0.3 mLs (0.3 mg total) into the muscle once. 07/26/14    Brunetta Jeans, PA-C   BP 138/91 mmHg  Pulse 71  Temp(Src) 98.1 F (36.7 C)  Resp 20  Ht 5\' 11"  (1.803 m)  Wt 240 lb (108.863 kg)  BMI 33.49 kg/m2  SpO2 98% Physical Exam  Constitutional: He is oriented to person, place, and time. He appears well-developed and well-nourished.  HENT:  Head: Normocephalic and atraumatic.  Eyes: EOM are normal. Pupils are equal, round, and reactive to light.  Neck: Normal range of motion. Neck supple. No JVD present.  Cardiovascular: Normal rate and regular rhythm.  Exam reveals no gallop and no friction rub.   No murmur heard. Pulmonary/Chest: No respiratory distress. He has no wheezes. He exhibits no tenderness.  Abdominal: He exhibits no distension. There is no rebound and no guarding.  Musculoskeletal: Normal range of motion.  Neurological: He is alert and oriented to person, place, and time.  Skin: No rash noted. No pallor.  Psychiatric: He has a normal mood and affect. His behavior is normal.  Nursing note and vitals reviewed.   ED Course  Procedures (including critical care time) Labs Review Labs Reviewed  BASIC METABOLIC PANEL - Abnormal; Notable for the following:    Glucose, Bld 102 (*)    All other components within normal limits  TROPONIN I  CBC WITH DIFFERENTIAL/PLATELET  CBC WITH DIFFERENTIAL/PLATELET    Imaging Review Dg Chest 2 View  12/19/2014  CLINICAL DATA:  Acute onset chest pain, dizziness. EXAM: CHEST  2 VIEW COMPARISON:  None. FINDINGS: Trachea is midline. Heart size normal. Lungs are clear. No pleural fluid. IMPRESSION: No acute findings. Electronically Signed   By: Lorin Picket M.D.   On: 12/19/2014 10:29   I have personally reviewed and evaluated these images and lab results as part of my medical decision-making.   EKG Interpretation   Date/Time:  Monday December 19 2014 09:50:48 EST Ventricular Rate:  64 PR Interval:  184 QRS Duration: 112 QT Interval:  404 QTC Calculation: 416 R Axis:   -32 Text  Interpretation:  Normal sinus rhythm Left axis deviation Incomplete  right bundle branch block Abnormal ECG No old tracing to compare Confirmed  by Sharonlee Nine MD, DANIEL [phone]) on 12/19/2014 9:54:22 AM      MDM   Final diagnoses:  Chest pain with high risk for cardiac etiology    59 yo M with a chief complaints of chest pain. His pain felt just likely had an MI a couple years ago. The pain is resolved by the time he came to the ED. He now feels better is requesting discharge. Discussed with patient concern for possible MI. Will obtain a troponin contact cardiology. EKG unchanged from prior.  Initial troponin negative. Case discussed with Dr. Radford Pax, cardiology. Will admit under her over at [facility]. Family updated. Patient reassessed and continues to be pain-free.  The patients results and plan were reviewed and discussed.   Any x-rays performed were independently reviewed by myself.   Differential  diagnosis were considered with the presenting HPI.  Medications  nitroGLYCERIN (NITROGLYN) 2 % ointment 1 inch (1 inch Topical Given 12/19/14 1019)  aspirin chewable tablet 243 mg (243 mg Oral Given 12/19/14 1019)    Filed Vitals:   12/19/14 0948 12/19/14 1000  BP: 146/101 138/91  Pulse: 66 71  Temp: 98.1 F (36.7 C)   Resp: 20   Height: 5\' 11"  (1.803 m)   Weight: 240 lb (108.863 kg)   SpO2: 100% 98%    Final diagnoses:  Chest pain with high risk for cardiac etiology    Admission/ observation were discussed with the admitting physician, patient and/or family and they are comfortable with the plan.    Deno Etienne, DO 12/19/14 [phone]

## 2014-12-20 ENCOUNTER — Encounter (HOSPITAL_COMMUNITY): Payer: Self-pay | Admitting: Physician Assistant

## 2014-12-20 ENCOUNTER — Observation Stay (HOSPITAL_COMMUNITY): Payer: BLUE CROSS/BLUE SHIELD

## 2014-12-20 ENCOUNTER — Observation Stay (HOSPITAL_BASED_OUTPATIENT_CLINIC_OR_DEPARTMENT_OTHER): Payer: BLUE CROSS/BLUE SHIELD

## 2014-12-20 DIAGNOSIS — I10 Essential (primary) hypertension: Secondary | ICD-10-CM | POA: Diagnosis not present

## 2014-12-20 DIAGNOSIS — R079 Chest pain, unspecified: Secondary | ICD-10-CM

## 2014-12-20 DIAGNOSIS — E785 Hyperlipidemia, unspecified: Secondary | ICD-10-CM | POA: Diagnosis not present

## 2014-12-20 DIAGNOSIS — I2510 Atherosclerotic heart disease of native coronary artery without angina pectoris: Secondary | ICD-10-CM | POA: Diagnosis not present

## 2014-12-20 LAB — NM MYOCAR MULTI W/SPECT W/WALL MOTION / EF
CHL CUP MPHR: 163 {beats}/min
CHL CUP NUCLEAR SDS: 1
CHL CUP NUCLEAR SRS: 5
CHL CUP NUCLEAR SSS: 6
CSEPHR: 60 %
LHR: 0.32
LV sys vol: 50 mL
LVDIAVOL: 115 mL
Peak HR: 98 {beats}/min
Rest HR: 60 {beats}/min
TID: 1.12

## 2014-12-20 LAB — BASIC METABOLIC PANEL
Anion gap: 7 (ref 5–15)
BUN: 8 mg/dL (ref 6–20)
CALCIUM: 9.3 mg/dL (ref 8.9–10.3)
CHLORIDE: 107 mmol/L (ref 101–111)
CO2: 26 mmol/L (ref 22–32)
CREATININE: 1.01 mg/dL (ref 0.61–1.24)
GFR calc non Af Amer: >60 mL/min (ref >60)
Glucose, Bld: 115 mg/dL — ABNORMAL HIGH (ref 65–99)
Potassium: 3.9 mmol/L (ref 3.5–5.1)
SODIUM: 140 mmol/L (ref 135–145)

## 2014-12-20 LAB — LIPID PANEL
CHOLESTEROL: 136 mg/dL (ref 0–200)
HDL: 38 mg/dL — ABNORMAL LOW (ref >40)
LDL CALC: 63 mg/dL (ref 0–99)
TRIGLYCERIDES: 173 mg/dL — AB (ref <150)
Total CHOL/HDL Ratio: 3.6 RATIO
VLDL: 35 mg/dL (ref 0–40)

## 2014-12-20 LAB — HEPARIN LEVEL (UNFRACTIONATED)
Heparin Unfractionated: 0.36 IU/mL (ref 0.30–0.70)
Heparin Unfractionated: 0.45 IU/mL (ref 0.30–0.70)

## 2014-12-20 LAB — TROPONIN I: Troponin I: <0.03 ng/mL (ref <0.031)

## 2014-12-20 MED ORDER — REGADENOSON 0.4 MG/5ML IV SOLN
INTRAVENOUS | Status: AC
  Administered 2014-12-20: 10:00:00
  Filled 2014-12-20: qty 5

## 2014-12-20 MED ORDER — TECHNETIUM TC 99M SESTAMIBI GENERIC - CARDIOLITE
30.0000 | Freq: Once | INTRAVENOUS | Status: AC | PRN
  Administered 2014-12-20: 30 via INTRAVENOUS

## 2014-12-20 MED ORDER — HEPARIN SODIUM (PORCINE) 5000 UNIT/ML IJ SOLN: 5000.0000 [IU] | Freq: Three times a day (TID) | INTRAMUSCULAR | Status: DC

## 2014-12-20 MED ORDER — HEPARIN SODIUM (PORCINE) 5000 UNIT/ML IJ SOLN
5000.0000 [IU] | Freq: Three times a day (TID) | INTRAMUSCULAR | Status: DC
  Administered 2014-12-20: 5000 [IU] via SUBCUTANEOUS
  Filled 2014-12-20: qty 1

## 2014-12-20 MED ORDER — NITROGLYCERIN 0.4 MG SL SUBL: 0.4000 mg | SUBLINGUAL_TABLET | SUBLINGUAL | Status: DC | PRN

## 2014-12-20 MED ORDER — TECHNETIUM TC 99M SESTAMIBI GENERIC - CARDIOLITE
10.0000 | Freq: Once | INTRAVENOUS | Status: AC | PRN
  Administered 2014-12-20: 10 via INTRAVENOUS

## 2014-12-20 NOTE — Progress Notes (Addendum)
Discharge Note:  Patient alert and oriented X 4 and in no distress. Patient and his spouse given discharge instructions regarding upcoming appointments, medications, activity, and signs and symptoms to report. Patient verbalized understanding of all discharge instructions.  Telemetry and peripheral IV discontinued.  Patient confirmed that he had all of his personal belongings. Patient declined to be transported out via wheelchair.  This RN walked out with the patient and his wife.

## 2014-12-20 NOTE — Progress Notes (Signed)
[place] for heparin Indication: chest pain/ACS  Allergies  Allergen Reactions  . Shellfish Allergy Swelling  . Penicillins Itching    Patient Measurements: Height: 5\' 11"  (180.3 cm) Weight: 240 lb (108.863 kg) IBW/kg (Calculated) : 75.3 Heparin Dosing Weight: 98.5 kg  Vital Signs: Temp: 97.7 F (36.5 C) (11/29 0601) Temp Source: Oral (11/29 0601) BP: 132/88 mmHg (11/29 0825) Pulse Rate: 58 (11/29 0825)  Labs:  Recent Labs  12/19/14 1000 12/19/14 1029 12/19/14 1910 12/20/14 0001 12/20/14 0030 12/20/14 0700  HGB  --  16.3  --   --   --   --   HCT  --  47.8  --   --   --   --   PLT  --  178  --   --   --   --   HEPARINUNFRC  --   --   --  0.36  --  0.45  CREATININE  --  0.92  --   --   --   --   TROPONINI <0.03  --  <0.03  --  <0.03  --     Estimated Creatinine Clearance: 111.1 mL/min (by C-G formula based on Cr of 0.92).  Assessment: 59 y.o. male with chest pain. Transferred from [facility] to [facility].  Pharmacy consulted to dose heparin for chest pain/ACS/STEMI.  CBC WNL. Not on blood thinners PTA. Enzymes negative so far. Stress test in this morning.  HL therapeutic x2 (0.45) on 1300 units/h - To follow daily HLs. No bleed documented.  Goal of Therapy:  Heparin level 0.3-0.7 units/ml Monitor platelets by anticoagulation protocol: Yes   Plan: Heparin @1300  units/h Daily HL/CBC Mon s/sx bleeding  Elicia Lamp, PharmD, [facility] Clinical Pharmacist Pager [phone] 12/20/2014 8:34 AM

## 2014-12-20 NOTE — Progress Notes (Signed)
SUBJECTIVE:  No complaints  OBJECTIVE:   Vitals:   Filed Vitals:   12/19/14 1429 12/19/14 2132 12/20/14 0601 12/20/14 0825  BP: 128/80 120/78 128/81 132/88  Pulse: 61 77 56 58  Temp: 97.4 F (36.3 C) 97.8 F (36.6 C) 97.7 F (36.5 C)   TempSrc: Oral Oral Oral   Resp: 20 18 18    Height:      Weight:      SpO2: 99% 98% 100%    I&O's:   Intake/Output Summary (Last 24 hours) at 12/20/14 0835 Last data filed at 12/19/14 1800  Gross per 24 hour  Intake    480 ml  Output      0 ml  Net    480 ml   TELEMETRY: Reviewed telemetry pt in NSR:     PHYSICAL EXAM General: Well developed, well nourished, in no acute distress Head: Eyes PERRLA, No xanthomas.   Normal cephalic and atramatic  Lungs:   Clear bilaterally to auscultation and percussion. Heart:   HRRR S1 S2 Pulses are 2+ & equal. Abdomen: Bowel sounds are positive, abdomen soft and non-tender without masses  Extremities:   No clubbing, cyanosis or edema.  DP +1 Neuro: Alert and oriented X 3. Psych:  Good affect, responds appropriately   LABS: Basic Metabolic Panel:  Recent Labs  12/19/14 1029  NA 138  K 4.1  CL 108  CO2 22  GLUCOSE 102*  BUN 12  CREATININE 0.92  CALCIUM 9.4   Liver Function Tests:  Recent Labs  12/19/14 1910  AST 23  ALT 16*  ALKPHOS 43  BILITOT 0.7  PROT 5.9*  ALBUMIN 3.4*   No results for input(s): LIPASE, AMYLASE in the last 72 hours. CBC:  Recent Labs  12/19/14 1029  WBC 6.2  NEUTROABS 3.3  HGB 16.3  HCT 47.8  MCV 89.7  PLT 178   Cardiac Enzymes:  Recent Labs  12/19/14 1000 12/19/14 1910 12/20/14 0030  TROPONINI <0.03 <0.03 <0.03   BNP: Invalid input(s): POCBNP D-Dimer: No results for input(s): DDIMER in the last 72 hours. Hemoglobin A1C: No results for input(s): HGBA1C in the last 72 hours. Fasting Lipid Panel: No results for input(s): CHOL, HDL, LDLCALC, TRIG, CHOLHDL, LDLDIRECT in the last 72 hours. Thyroid Function Tests: No results for  input(s): TSH, T4TOTAL, T3FREE, THYROIDAB in the last 72 hours.  Invalid input(s): FREET3 Anemia Panel: No results for input(s): VITAMINB12, FOLATE, FERRITIN, TIBC, IRON, RETICCTPCT in the last 72 hours. Coag Panel:   No results found for: INR, PROTIME  RADIOLOGY: Dg Chest 2 View  12/19/2014  CLINICAL DATA:  Acute onset chest pain, dizziness. EXAM: CHEST  2 VIEW COMPARISON:  None. FINDINGS: Trachea is midline. Heart size normal. Lungs are clear. No pleural fluid. IMPRESSION: No acute findings. Electronically Signed   By: Lorin Picket M.D.   On: 12/19/2014 10:29   ASSESSMENT AND PLAN  1. Chest pain with high risk for cardiac etiology - His chest pain is similar to previous cardiac pain. However, less severe. Chest pain improved on nitroglycerin patch. Troponin x2 negative. EKG nonischemic. - Stress Myoview today.  - Continue atenolol 25 MG twice a day, fenofibrate 145 MG and Crestor 20 MG.  2. CAD - s/p Promus drug-eluting stent placement to the LAD. Continue ASA/statin/BB  3. HL - Recently his triglyceride was over thousand that was improved on TriCor with dietary changes. He is due to get his repeat lipid profile.  - Will get lipid profile and LFT.  4. L breast bump/knot - Complained of bump/knot in the left breast at the 7 to 8:00 position from the left nipple when seen Dr. Debara Pickett. He says is not painful and does not feel that is changed in size. There is no history of early breast cancer in the family. Pending ultrasound evaluation of it for further evaluation.  - He does complaining of another knot in L bump at 3 O'clock position.  - Ultrasound as outpatient.  5. HTN - stable and well controlled. Continue current regimen.     Sueanne Margarita, MD  12/20/2014  8:35 AM

## 2014-12-20 NOTE — Progress Notes (Signed)
[place] for heparin Indication: chest pain/ACS  Allergies  Allergen Reactions  . Shellfish Allergy Swelling  . Penicillins Itching    Patient Measurements: Height: 5\' 11"  (180.3 cm) Weight: 240 lb (108.863 kg) IBW/kg (Calculated) : 75.3 Heparin Dosing Weight: 98.5 kg  Vital Signs: Temp: 97.7 F (36.5 C) (11/29 0601) Temp Source: Oral (11/29 0601) BP: 145/89 mmHg (11/29 0844) Pulse Rate: 78 (11/29 0844)  Labs:  Recent Labs  12/19/14 1000 12/19/14 1029 12/19/14 1910 12/20/14 0001 12/20/14 0030 12/20/14 0700  HGB  --  16.3  --   --   --   --   HCT  --  47.8  --   --   --   --   PLT  --  178  --   --   --   --   HEPARINUNFRC  --   --   --  0.36  --  0.45  CREATININE  --  0.92  --   --   --   --   TROPONINI <0.03  --  <0.03  --  <0.03  --     Estimated Creatinine Clearance: 111.1 mL/min (by C-G formula based on Cr of 0.92).  Assessment: 59 y.o. male with chest pain. Transferred from [facility] to [facility]  Pharmacy consulted to dose heparin for chest pain/ACS/STEMI.  CBC WNL. Not on blood thinners PTA. Enzymes negative so far. Stress test in this morning.  HL therapeutic x2 (0.45) on 1300 units/h - To follow daily HLs. No bleed documented.  Goal of Therapy:  Heparin level 0.3-0.7 units/ml Monitor platelets by anticoagulation protocol: Yes   Plan: Heparin @1300  units/h Daily HL/CBC Mon s/sx bleeding  Elicia Lamp, PharmD, BCPS Clinical Pharmacist Pager [phone] 12/20/2014 8:45 AM   ADDENDUM:  Pharmacy consulted for heparin for VTE ppx. Dr. Radford Pax d/c'd heparin drip. Communicated with RN to start heparin [place] 1 hour after heparin drip is discontinued.  Plan D/c heparin IV Start heparin 5000 units [place] q8h Mon CBC, s/sx bleeding  Elicia Lamp, PharmD, [facility] Clinical Pharmacist Pager [phone] 12/20/2014 8:46 AM

## 2014-12-20 NOTE — Progress Notes (Signed)
[place] for heparin Indication: chest pain/ACS  Allergies  Allergen Reactions  . Shellfish Allergy Swelling  . Penicillins Itching    Patient Measurements: Height: 5\' 11"  (180.3 cm) Weight: 240 lb (108.863 kg) IBW/kg (Calculated) : 75.3 Heparin Dosing Weight: 98.5 kg  Vital Signs: Temp: 97.8 F (36.6 C) (11/28 2132) Temp Source: Oral (11/28 2132) BP: 120/78 mmHg (11/28 2132) Pulse Rate: 77 (11/28 2132)  Labs:  Recent Labs  12/19/14 1000 12/19/14 1029 12/19/14 1910 12/20/14 0001  HGB  --  16.3  --   --   HCT  --  47.8  --   --   PLT  --  178  --   --   HEPARINUNFRC  --   --   --  0.36  CREATININE  --  0.92  --   --   TROPONINI <0.03  --  <0.03  --     Estimated Creatinine Clearance: 111.1 mL/min (by C-G formula based on Cr of 0.92).  Assessment: 59 y.o. male with chest pain for heparin   Goal of Therapy:  Heparin level 0.3-0.7 units/ml Monitor platelets by anticoagulation protocol: Yes   Plan: Continue Heparin at current rate  Follow-up am labs.  Phillis Knack, PharmD, BCPS  12/20/2014 1:04 AM

## 2014-12-20 NOTE — Discharge Instructions (Signed)

## 2014-12-20 NOTE — Progress Notes (Deleted)
Patient Name: Gregory Delacruz Date of Encounter: 12/20/2014     Active Problems:   Essential hypertension   Coronary artery disease   Hypertriglyceridemia   Chest pain with high risk for cardiac etiology   Chest pain    SUBJECTIVE  No further chest pain. Seen in nuclear stress lab for lexiscan myoview. No further complaints.   CURRENT MEDS . aspirin EC  81 mg Oral Daily  . atenolol  25 mg Oral BID  . fenofibrate  160 mg Oral Daily  . nitroGLYCERIN  0.2 mg Transdermal Daily  . regadenoson      . rosuvastatin  20 mg Oral Daily    OBJECTIVE  Filed Vitals:   12/19/14 1429 12/19/14 2132 12/20/14 0601 12/20/14 0825  BP: 128/80 120/78 128/81 132/88  Pulse: 61 77 56 58  Temp: 97.4 F (36.3 C) 97.8 F (36.6 C) 97.7 F (36.5 C)   TempSrc: Oral Oral Oral   Resp: 20 18 18    Height:      Weight:      SpO2: 99% 98% 100%     Intake/Output Summary (Last 24 hours) at 12/20/14 0835 Last data filed at 12/19/14 1800  Gross per 24 hour  Intake    480 ml  Output      0 ml  Net    480 ml   Filed Weights   12/19/14 0948  Weight: 240 lb (108.863 kg)    PHYSICAL EXAM  General: Pleasant, NAD. obese Neuro: Alert and oriented X 3. Moves all extremities spontaneously. Psych: Normal affect. HEENT:  Normal  Neck: Supple without bruits or JVD. Lungs:  Resp regular and unlabored, CTA. Heart: RRR no s3, s4, or murmurs. Abdomen: Soft, non-tender, non-distended, BS + x 4.  Extremities: No clubbing, cyanosis or edema. DP/PT/Radials 2+ and equal bilaterally.  Accessory Clinical Findings  CBC  Recent Labs  12/19/14 1029  WBC 6.2  NEUTROABS 3.3  HGB 16.3  HCT 47.8  MCV 89.7  PLT 0000000   Basic Metabolic Panel  Recent Labs  12/19/14 1029  NA 138  K 4.1  CL 108  CO2 22  GLUCOSE 102*  BUN 12  CREATININE 0.92  CALCIUM 9.4   Liver Function Tests  Recent Labs  12/19/14 1910  AST 23  ALT 16*  ALKPHOS 43  BILITOT 0.7  PROT 5.9*  ALBUMIN 3.4*   No results for  input(s): LIPASE, AMYLASE in the last 72 hours. Cardiac Enzymes  Recent Labs  12/19/14 1000 12/19/14 1910 12/20/14 0030  TROPONINI <0.03 <0.03 <0.03   TELE  NSR  Radiology/Studies  Dg Chest 2 View  12/19/2014  CLINICAL DATA:  Acute onset chest pain, dizziness. EXAM: CHEST  2 VIEW COMPARISON:  None. FINDINGS: Trachea is midline. Heart size normal. Lungs are clear. No pleural fluid. IMPRESSION: No acute findings. Electronically Signed   By: Lorin Picket M.D.   On: 12/19/2014 10:29    ASSESSMENT AND PLAN  Gregory Delacruz is a 59 y.o. male with a history of CAD, HTN and HL who admitted directly to [facility] from [place] for evaluation of chest pain.   Chest pain/CAD s/p DES to the LAD (2014) -- His chest pain was similar to previous cardiac pain. However, less severe. Chest pain improved on nitroglycerin patch. EKG nonischemic. Troponin neg 3x.  -- He has had no further chest pain. Nuclear stress test today. Will await images.  -- Continue ASA, atenolol 25 MG twice a day, fenofibrate 145 MG and Crestor 20  MG.  HLD -- Recently his TG > 1000 and he was placed on TriCor with dietary changes. He is due to get his repeat lipid profile. Will obtain lipid profile and LFT while inpatient.   L breast bump/knot -- Complained of bump/knot in the left breast at the 7 to 8:00 position from the left nipple when seen Dr. Debara Pickett. He says is not painful and does not feel that is changed in size. There is no history of early breast cancer in the family. Pending ultrasound evaluation of it for further evaluation.  -- Ultrasound as outpatient.  HTN -- Well controlled. Continue current regimen.   Judy Pimple PA-C  Pager [phone]

## 2014-12-20 NOTE — Discharge Summary (Signed)
Discharge Summary   Patient ID: Gregory Delacruz MRN: [id number], DOB/AGE: 05-28-1955 59 y.o. Admit date: 12/19/2014 D/C date:     12/20/2014  Primary Cardiologist: Dr. Debara Pickett  Principal Problem:   Chest pain Active Problems:   Essential hypertension   Coronary artery disease   Hypertriglyceridemia   Breast lump on left side at 7 o'clock position   Hyperlipidemia    Admission Dates: 12/19/14-12/20/14 Discharge Diagnosis: chest pain s/p low risk nuclear stress test.   HPI: Gregory Delacruz is a 59 y.o. male with a history of CAD s/p DES to LAD (2014), HTN and HL who admitted directly to [facility] from [place] for evaluation of chest pain.   He recently moved back to [place] however lived in [place] for about 12 years. In November 2014 he had acute onset chest pain and presented to [facility]. He had a an anterior ST elevation MI and underwent a Promus drug-eluting stent placement to the LAD. This was a Promus Premier 2.75 x 20 mm stent. He took aspirin and Plavix for 1 year and then drop back to aspirin therapy. He is also on atenolol and TriCor.Recently established care with Dr. Debara Pickett, last seen 12/12/14. At that time he was complaining of bump/knot in the left breast at the 7 to 8:00 position from the left nipple. He says is not painful and does not feel that is changed in size. There is no history of early breast cancer in the family. Pending ultrasound evaluation of it for further evaluation. Recently his triglyceride was over thousand that was improved on TriCor with dietary changes. He is due to get his repeat lipid profile.   He was in his usual state of there up until the morning of admission  when he had a sudden episode of vomiting. Then he developed a left-sided chest pain that he described as a sharp. The pain persisted and he was in his way to his job, while driving he suddenly got dizziness and again developed a sharp pain. This laid him to present to [facility] as this is in his way to his job. He states that his chest pain lasted for 2-3 hours and is off after nitroglycerin patch. This episode is similar to his cardiac pain, however, less severe. He was transferred to [facility] for further evaluation.   Hospital Course  Chest pain/CAD s/p DES to the LAD (2014) -- His chest pain was similar to previous cardiac pain. However, less severe. EKG nonischemic. Troponin neg x3.  -- He has had no further chest pain. He underwent nuclear stress test which revealed no inducible ischemia. Low risk study with EF 55-60% and some HK of septum but overall normal systolic function.  -- Continue ASA, atenolol 25 MG twice a day, fenofibrate 145 MG and Crestor 20 MG.  HLD -- Recently his TG > 1000 and he was placed on TriCor with dietary changes. He was due to get his repeat lipid profile. This was obtained while inpatient. TG 173 and LDL 63. Hepatic function normal. Continue current regimen as he has had a lot of improvement.    L breast bump/knot -- Complained of bump/knot in the left breast at the 7 to 8:00 position from the left nipple when seen Dr. Debara Pickett. He says is not painful and does not feel that is changed in size. There is no history of early breast cancer in the family.  -- Ultrasound as outpatient scheduled for tomorrow per  patient. He doesn't know if he will be able to make this appointment but I have encouraged him to keep it.   HTN -- Well controlled. Continue current regimen.   The patient has had an uncomplicated hospital course and is recovering well. He has been seen by Dr. Heron Nay today and deemed ready for discharge home. All follow-up appointments have been scheduled. Discharge medications are listed below.   Discharge Vitals: Blood pressure 145/89, pulse 78, temperature 97.7 F (36.5 C), temperature source Oral, resp. rate 18, height 5\' 11"  (1.803 m), weight 240 lb (108.863 kg), SpO2 100 %.  Labs: Lab Results  Component  Value Date   WBC 6.2 12/19/2014   HGB 16.3 12/19/2014   HCT 47.8 12/19/2014   MCV 89.7 12/19/2014   PLT 178 12/19/2014     Recent Labs Lab 12/19/14 1910 12/20/14 1055  NA  --  140  K  --  3.9  CL  --  107  CO2  --  26  BUN  --  8  CREATININE  --  1.01  CALCIUM  --  9.3  PROT 5.9*  --   BILITOT 0.7  --   ALKPHOS 43  --   ALT 16*  --   AST 23  --   GLUCOSE  --  115*    Recent Labs  12/19/14 1000 12/19/14 1910 12/20/14 0030 12/20/14 1055  TROPONINI <0.03 <0.03 <0.03 <0.03   Lab Results  Component Value Date   CHOL 136 12/20/2014   HDL 38* 12/20/2014   LDLCALC 63 12/20/2014   TRIG 173* 12/20/2014     Diagnostic Studies/Procedures   Dg Chest 2 View  12/19/2014  CLINICAL DATA:  Acute onset chest pain, dizziness. EXAM: CHEST  2 VIEW COMPARISON:  None. FINDINGS: Trachea is midline. Heart size normal. Lungs are clear. No pleural fluid. IMPRESSION: No acute findings. Electronically Signed   By: Lorin Picket M.D.   On: 12/19/2014 10:29   Nm Myocar Multi W/spect W/wall Motion / Ef  12/20/2014   The study is normal with no evidence of inducible ischemia.  This is a low risk study.  The left ventricular ejection fraction is normal (55-65%).  There was hypokinesis of the septum but overall normal systolic function.     Discharge Medications     Medication List    TAKE these medications        aspirin 81 MG tablet  Take 81 mg by mouth daily.     atenolol 25 MG tablet  Commonly known as:  TENORMIN  Take 1 tablet (25 mg total) by mouth 2 (two) times daily.     EPINEPHrine 0.3 mg/0.3 mL Soaj injection  Commonly known as:  EPI-PEN  Inject 0.3 mLs (0.3 mg total) into the muscle once.     fenofibrate 145 MG tablet  Commonly known as:  TRICOR  Take 145 mg by mouth every other day.     naproxen sodium 220 MG tablet  Commonly known as:  ANAPROX  Take 440 mg by mouth daily as needed (for pain).     nitroGLYCERIN 0.4 MG SL tablet  Commonly known as:   NITROSTAT  Place 1 tablet (0.4 mg total) under the tongue every 5 (five) minutes x 3 doses as needed for chest pain.     rosuvastatin 20 MG tablet  Commonly known as:  CRESTOR  Take 1 tablet (20 mg total) by mouth daily.     tiZANidine 2 MG tablet  Commonly known as:  ZANAFLEX  Take 2 mg by mouth every 8 (eight) hours as needed for muscle spasms.        Disposition   The patient will be discharged in stable condition to home.  Follow-up Information    Follow up with Leeanne Rio, PA-C.   Specialty:  Family Medicine   Why:  Please follow up with your PCP about breast mass and attend your previously scheduled ultrasound tomorrow   Contact information:   [address] [place] [address] [phone]       Follow up with Pixie Casino, MD.   Specialty:  Cardiology   Why:  Follow up as previously scheduled. Call us sooner if you have any further issues.   Contact information:   [place] [address] [phone]         Duration of Discharge Encounter: Greater than 30 minutes including physician and PA time.  Audrie Gallus, [place] R PA-C 12/20/2014, 1:13 PM

## 2014-12-21 ENCOUNTER — Ambulatory Visit
Admission: RE | Admit: 2014-12-21 | Discharge: 2014-12-21 | Disposition: A | Discharge status: Home / Self Care | Payer: BLUE CROSS/BLUE SHIELD | Source: Ambulatory Visit | Attending: Internal Medicine | Admitting: Internal Medicine

## 2014-12-21 ENCOUNTER — Other Ambulatory Visit: Payer: Self-pay | Admitting: Internal Medicine

## 2014-12-21 ENCOUNTER — Telehealth: Payer: Self-pay | Admitting: Behavioral Health

## 2014-12-21 ENCOUNTER — Telehealth: Payer: Self-pay | Admitting: Internal Medicine

## 2014-12-21 DIAGNOSIS — N6320 Unspecified lump in the left breast, unspecified quadrant: Secondary | ICD-10-CM

## 2014-12-21 NOTE — Telephone Encounter (Signed)
Received call from [facility] with [place] calling to get a verbal order for bilateral mammograms.Spoke to Dr.Hilty he advised ok for bilat mammograms.

## 2014-12-21 NOTE — Telephone Encounter (Signed)
Patient declined TCM/Hospital Follow-up at this time. He verbalized that just recently he had nuclear and enzyme testings completed, along with labs (specifically cholesterol) and everything checked out just fine. Patient stated, "Im doing good" and thanked Probation officer for taking the time to follow-up with him after his discharge from the hospital.

## 2014-12-21 NOTE — Telephone Encounter (Signed)
Megan called in needing correction to some orders that Dr.Hilty sent to the office. Please assist  Thanks

## 2014-12-22 ENCOUNTER — Telehealth: Payer: Self-pay | Admitting: Internal Medicine

## 2014-12-22 NOTE — Telephone Encounter (Signed)
Pt's HR dept called needing work note furnished. They received one but it was not signed.  They need this signed and if possible, faxed to [phone]  Routing to provider.

## 2014-12-23 ENCOUNTER — Encounter: Payer: Self-pay | Admitting: Internal Medicine

## 2014-12-23 NOTE — Telephone Encounter (Signed)
Faxed copy of letter to provided number.

## 2014-12-23 NOTE — Telephone Encounter (Signed)
Called and informed HR dept for pt's employer to anticipate fax. Gave call back number to reach me if problems.

## 2014-12-23 NOTE — Telephone Encounter (Signed)
I've printed and signed a letter for him.  DR. Lemmie Evens

## 2015-09-18 ENCOUNTER — Ambulatory Visit (INDEPENDENT_AMBULATORY_CARE_PROVIDER_SITE_OTHER): Payer: BLUE CROSS/BLUE SHIELD | Admitting: Family Medicine

## 2015-09-18 ENCOUNTER — Encounter: Payer: Self-pay | Admitting: Family Medicine

## 2015-09-18 VITALS — BP 134/80 | HR 64 | Temp 98.0°F | Ht 71.0 in | Wt 250.8 lb

## 2015-09-18 DIAGNOSIS — M545 Low back pain, unspecified: Secondary | ICD-10-CM

## 2015-09-18 DIAGNOSIS — I10 Essential (primary) hypertension: Secondary | ICD-10-CM

## 2015-09-18 MED ORDER — ATENOLOL 25 MG PO TABS: 25.0000 mg | ORAL_TABLET | Freq: Every day | ORAL | 3 refills | Status: AC

## 2015-09-18 MED ORDER — NAPROXEN SODIUM 275 MG PO TABS: 275.0000 mg | ORAL_TABLET | Freq: Two times a day (BID) | ORAL | 0 refills | Status: DC

## 2015-09-18 MED ORDER — CYCLOBENZAPRINE HCL 10 MG PO TABS: 10.0000 mg | ORAL_TABLET | Freq: Three times a day (TID) | ORAL | 0 refills | Status: DC | PRN

## 2015-09-18 NOTE — Patient Instructions (Signed)
EXERCISES  RANGE OF MOTION (ROM) AND STRETCHING EXERCISES - Low Back Strain Most people with lower back pain will find that their symptoms get worse with excessive bending forward (flexion) or arching at the lower back (extension). The exercises which will help resolve your symptoms will focus on the opposite motion.  Your physician, physical therapist or athletic trainer will help you determine which exercises will be most helpful to resolve your lower back pain. Do not complete any exercises without first consulting with your caregiver. Discontinue any exercises which make your symptoms worse until you speak to your caregiver.  If you have pain, numbness or tingling which travels down into your buttocks, leg or foot, the goal of the therapy is for these symptoms to move closer to your back and eventually resolve. Sometimes, these leg symptoms will get better, but your lower back pain may worsen. This is typically an indication of progress in your rehabilitation. Be very alert to any changes in your symptoms and the activities in which you participated in the 24 hours prior to the change. Sharing this information with your caregiver will allow him/her to most efficiently treat your condition.  These exercises may help you when beginning to rehabilitate your injury. Your symptoms may resolve with or without further involvement from your physician, physical therapist or athletic trainer. While completing these exercises, remember:  Restoring tissue flexibility helps normal motion to return to the joints. This allows healthier, less painful movement and activity.  An effective stretch should be held for at least 30 seconds.  A stretch should never be painful. You should only feel a gentle lengthening or release in the stretched tissue.  FLEXION RANGE OF MOTION AND STRETCHING EXERCISES: STRETCH - Flexion, Single Knee to Chest   Lie on a firm bed or floor with both legs extended in front of  you.  Keeping one leg in contact with the floor, bring your opposite knee to your chest. Hold your leg in place by either grabbing behind your thigh or at your knee.  Pull until you feel a gentle stretch in your lower back. Hold __________ seconds.  Slowly release your grasp and repeat the exercise with the opposite side. Repeat __________ times. Complete this exercise __________ times per day.   STRETCH - Flexion, Double Knee to Chest   Lie on a firm bed or floor with both legs extended in front of you.  Keeping one leg in contact with the floor, bring your opposite knee to your chest.  Tense your stomach muscles to support your back and then lift your other knee to your chest. Hold your legs in place by either grabbing behind your thighs or at your knees.  Pull both knees toward your chest until you feel a gentle stretch in your lower back. Hold __________ seconds.  Tense your stomach muscles and slowly return one leg at a time to the floor. Repeat __________ times. Complete this exercise __________ times per day.   STRETCH - Low Trunk Rotation  Lie on a firm bed or floor. Keeping your legs in front of you, bend your knees so they are both pointed toward the ceiling and your feet are flat on the floor.  Extend your arms out to the side. This will stabilize your upper body by keeping your shoulders in contact with the floor.  Gently and slowly drop both knees together to one side until you feel a gentle stretch in your lower back. Hold for __________ seconds.  Tense your  stomach muscles to support your lower back as you bring your knees back to the starting position. Repeat the exercise to the other side. Repeat __________ times. Complete this exercise __________ times per day  EXTENSION RANGE OF MOTION AND FLEXIBILITY EXERCISES:  STRETCH - Extension, Prone on Elbows   Lie on your stomach on the floor, a bed will be too soft. Place your palms about shoulder width apart and at the  height of your head.  Place your elbows under your shoulders. If this is too painful, stack pillows under your chest.  Allow your body to relax so that your hips drop lower and make contact more completely with the floor.  Hold this position for __________ seconds.  Slowly return to lying flat on the floor. Repeat __________ times. Complete this exercise __________ times per day.   RANGE OF MOTION - Extension, Prone Press Ups  Lie on your stomach on the floor, a bed will be too soft. Place your palms about shoulder width apart and at the height of your head.  Keeping your back as relaxed as possible, slowly straighten your elbows while keeping your hips on the floor. You may adjust the placement of your hands to maximize your comfort. As you gain motion, your hands will come more underneath your shoulders.  Hold this position __________ seconds.  Slowly return to lying flat on the floor. Repeat __________ times. Complete this exercise __________ times per day.   RANGE OF MOTION- Quadruped, Neutral Spine   Assume a hands and knees position on a firm surface. Keep your hands under your shoulders and your knees under your hips. You may place padding under your knees for comfort.  Drop your head and point your tail bone toward the ground below you. This will round out your lower back like an angry cat. Hold this position for __________ seconds.  Slowly lift your head and release your tail bone so that your back sags into a large arch, like an old horse.  Hold this position for __________ seconds.  Repeat this until you feel limber in your lower back.  Now, find your "sweet spot." This will be the most comfortable position somewhere between the two previous positions. This is your neutral spine. Once you have found this position, tense your stomach muscles to support your lower back.  Hold this position for __________ seconds. Repeat __________ times. Complete this exercise __________  times per day.  STRENGTHENING EXERCISES - Low Back Strain These exercises may help you when beginning to rehabilitate your injury. These exercises should be done near your "sweet spot." This is the neutral, low-back arch, somewhere between fully rounded and fully arched, that is your least painful position. When performed in this safe range of motion, these exercises can be used for people who have either a flexion or extension based injury. These exercises may resolve your symptoms with or without further involvement from your physician, physical therapist or athletic trainer. While completing these exercises, remember:   Muscles can gain both the endurance and the strength needed for everyday activities through controlled exercises.  Complete these exercises as instructed by your physician, physical therapist or athletic trainer. Increase the resistance and repetitions only as guided.  You may experience muscle soreness or fatigue, but the pain or discomfort you are trying to eliminate should never worsen during these exercises. If this pain does worsen, stop and make certain you are following the directions exactly. If the pain is still present after adjustments, discontinue  the exercise until you can discuss the trouble with your caregiver.  STRENGTHENING - Deep Abdominals, Pelvic Tilt  Lie on a firm bed or floor. Keeping your legs in front of you, bend your knees so they are both pointed toward the ceiling and your feet are flat on the floor.  Tense your lower abdominal muscles to press your lower back into the floor. This motion will rotate your pelvis so that your tail bone is scooping upwards rather than pointing at your feet or into the floor.  With a gentle tension and even breathing, hold this position for __________ seconds. Repeat __________ times. Complete this exercise __________ times per day.   STRENGTHENING - Abdominals, Crunches   Lie on a firm bed or floor. Keeping your legs  in front of you, bend your knees so they are both pointed toward the ceiling and your feet are flat on the floor. Cross your arms over your chest.  Slightly tip your chin down without bending your neck.  Tense your abdominals and slowly lift your trunk high enough to just clear your shoulder blades. Lifting higher can put excessive stress on the lower back and does not further strengthen your abdominal muscles.  Control your return to the starting position. Repeat __________ times. Complete this exercise __________ times per day.   STRENGTHENING - Quadruped, Opposite UE/LE Lift   Assume a hands and knees position on a firm surface. Keep your hands under your shoulders and your knees under your hips. You may place padding under your knees for comfort.  Find your neutral spine and gently tense your abdominal muscles so that you can maintain this position. Your shoulders and hips should form a rectangle that is parallel with the floor and is not twisted.  Keeping your trunk steady, lift your right hand no higher than your shoulder and then your left leg no higher than your hip. Make sure you are not holding your breath. Hold this position __________ seconds.  Continuing to keep your abdominal muscles tense and your back steady, slowly return to your starting position. Repeat with the opposite arm and leg. Repeat __________ times. Complete this exercise __________ times per day.   STRENGTHENING - Lower Abdominals, Double Knee Lift  Lie on a firm bed or floor. Keeping your legs in front of you, bend your knees so they are both pointed toward the ceiling and your feet are flat on the floor.  Tense your abdominal muscles to brace your lower back and slowly lift both of your knees until they come over your hips. Be certain not to hold your breath.  Hold __________ seconds. Using your abdominal muscles, return to the starting position in a slow and controlled manner. Repeat __________ times.  Complete this exercise __________ times per day.  POSTURE AND BODY MECHANICS CONSIDERATIONS - Low Back Strain Keeping correct posture when sitting, standing or completing your activities will reduce the stress put on different body tissues, allowing injured tissues a chance to heal and limiting painful experiences. The following are general guidelines for improved posture. Your physician or physical therapist will provide you with any instructions specific to your needs. While reading these guidelines, remember:  The exercises prescribed by your provider will help you have the flexibility and strength to maintain correct postures.  The correct posture provides the best environment for your joints to work. All of your joints have less wear and tear when properly supported by a spine with good posture. This means you will experience  a healthier, less painful body.  Correct posture must be practiced with all of your activities, especially prolonged sitting and standing. Correct posture is as important when doing repetitive low-stress activities (typing) as it is when doing a single heavy-load activity (lifting).  RESTING POSITIONS Consider which positions are most painful for you when choosing a resting position. If you have pain with flexion-based activities (sitting, bending, stooping, squatting), choose a position that allows you to rest in a less flexed posture. You would want to avoid curling into a fetal position on your side. If your pain worsens with extension-based activities (prolonged standing, working overhead), avoid resting in an extended position such as sleeping on your stomach. Most people will find more comfort when they rest with their spine in a more neutral position, neither too rounded nor too arched. Lying on a non-sagging bed on your side with a pillow between your knees, or on your back with a pillow under your knees will often provide some relief. Keep in mind, being in any one  position for a prolonged period of time, no matter how correct your posture, can still lead to stiffness. PROPER SITTING POSTURE In order to minimize stress and discomfort on your spine, you must sit with correct posture. Sitting with good posture should be effortless for a healthy body. Returning to good posture is a gradual process. Many people can work toward this most comfortably by using various supports until they have the flexibility and strength to maintain this posture on their own. When sitting with proper posture, your ears will fall over your shoulders and your shoulders will fall over your hips. You should use the back of the chair to support your upper back. Your lower back will be in a neutral position, just slightly arched. You may place a small pillow or folded towel at the base of your lower back for support.  When working at a desk, create an environment that supports good, upright posture. Without extra support, muscles tire, which leads to excessive strain on joints and other tissues. Keep these recommendations in mind:  CHAIR:  A chair should be able to slide under your desk when your back makes contact with the back of the chair. This allows you to work closely.  The chair's height should allow your eyes to be level with the upper part of your monitor and your hands to be slightly lower than your elbows.  BODY POSITION  Your feet should make contact with the floor. If this is not possible, use a foot rest.  Keep your ears over your shoulders. This will reduce stress on your neck and lower back. INCORRECT SITTING POSTURES  If you are feeling tired and unable to assume a healthy sitting posture, do not slouch or slump. This puts excessive strain on your back tissues, causing more damage and pain. Healthier options include:  Using more support, like a lumbar pillow.  Switching tasks to something that requires you to be upright or walking.  Talking a brief walk.  Lying  down to rest in a neutral-spine position.  PROLONGED STANDING WHILE SLIGHTLY LEANING FORWARD  When completing a task that requires you to lean forward while standing in one place for a long time, place either foot up on a stationary 2-4 inch high object to help maintain the best posture. When both feet are on the ground, the lower back tends to lose its slight inward curve. If this curve flattens (or becomes too large), then the back and  your other joints will experience too much stress, tire more quickly, and can cause pain.  CORRECT STANDING POSTURES Proper standing posture should be assumed with all daily activities, even if they only take a few moments, like when brushing your teeth. As in sitting, your ears should fall over your shoulders and your shoulders should fall over your hips. You should keep a slight tension in your abdominal muscles to brace your spine. Your tailbone should point down to the ground, not behind your body, resulting in an over-extended swayback posture.   INCORRECT STANDING POSTURES  Common incorrect standing postures include a forward head, locked knees and/or an excessive swayback. WALKING Walk with an upright posture. Your ears, shoulders and hips should all line-up.  PROLONGED ACTIVITY IN A FLEXED POSITION When completing a task that requires you to bend forward at your waist or lean over a low surface, try to find a way to stabilize 3 out of 4 of your limbs. You can place a hand or elbow on your thigh or rest a knee on the surface you are reaching across. This will provide you more stability so that your muscles do not fatigue as quickly. By keeping your knees relaxed, or slightly bent, you will also reduce stress across your lower back. CORRECT LIFTING TECHNIQUES  DO :   Assume a wide stance. This will provide you more stability and the opportunity to get as close as possible to the object which you are lifting.  Tense your abdominals to brace your spine. Bend  at the knees and hips. Keeping your back locked in a neutral-spine position, lift using your leg muscles. Lift with your legs, keeping your back straight.  Test the weight of unknown objects before attempting to lift them.  Try to keep your elbows locked down at your sides in order get the best strength from your shoulders when carrying an object.  Always ask for help when lifting heavy or awkward objects. INCORRECT LIFTING TECHNIQUES  DO NOT:   Lock your knees when lifting, even if it is a small object.  Bend and twist. Pivot at your feet or move your feet when needing to change directions.  Assume that you can safely pick up even a paper clip without proper posture.

## 2015-09-18 NOTE — Progress Notes (Signed)
Pre visit review using our clinic review tool, if applicable. No additional management support is needed unless otherwise documented below in the visit note. 

## 2015-09-18 NOTE — Progress Notes (Signed)
Musculoskeletal Exam  Patient: Gregory Delacruz DOB: Jun 03, 1955  DOS: 09/18/2015  SUBJECTIVE:  Chief Complaint:   Chief Complaint  Patient presents with  . Flank Pain    started last Monday-(L) side-radiating down the leg and up to the neck and shoulder.    Gregory Delacruz is a 60 y.o.  male for evaluation and treatment of his back pain.   Onset:  1 week ago.  sudden.  Location: lower left Character:  sharp  Progression of issue:  is moderately worse Associated symptoms: Radiates to leg and neck Denies bowel/bladder incontinence or weakness; does feel that his stream is not as strong Treatment: to date has been none.   Neurovascular symptoms: no +Hx of kidney stone, does not feel like that. No blood in urine, injury or change in activity.  ROS: Gastrointestinal: no bowel incontinence, +diarrhea Genitourinary: No bladder incontinence Musculoskeletal/Extremities: +back pain Neurologic: no numbness, tingling no weakness   Past Medical History:  Diagnosis Date  . Breast lump   . Coronary artery disease    a. s/p DES to LAD (2014).  b. low risk MPS 11/2014.  Marland Kitchen Hyperlipidemia   . Hypertension   . Hypertriglyceridemia    Past Surgical History:  Procedure Laterality Date  . BICEPS TENDON REPAIR  1997  . CORONARY ANGIOPLASTY WITH STENT PLACEMENT  12/02/2012   stent to LAD    Family History  Problem Relation Age of Onset  . Diabetes Mother   . Hypertension Mother   . Cancer Father 55    prostate   . Heart disease Father 39    triple bypass   . Diabetes Father   . Hypertension Father   . Stroke Maternal Grandmother   . Heart attack Paternal Grandmother   . Cancer Paternal Grandfather    Current Outpatient Prescriptions  Medication Sig Dispense Refill  . aspirin 81 MG tablet Take 81 mg by mouth daily.    Marland Kitchen atenolol (TENORMIN) 25 MG tablet Take 1 tablet (25 mg total) by mouth 2 (two) times daily. (Patient taking differently: Take 25 mg by mouth daily. ) 90 tablet 1   . EPINEPHrine 0.3 mg/0.3 mL IJ SOAJ injection Inject 0.3 mLs (0.3 mg total) into the muscle once. 1 Device 0  . fenofibrate (TRICOR) 145 MG tablet Take 145 mg by mouth every other day.    . nitroGLYCERIN (NITROSTAT) 0.4 MG SL tablet Place 1 tablet (0.4 mg total) under the tongue every 5 (five) minutes x 3 doses as needed for chest pain. 25 tablet 12  . rosuvastatin (CRESTOR) 20 MG tablet Take 1 tablet (20 mg total) by mouth daily. (Patient taking differently: Take 20 mg by mouth every evening. ) 90 tablet 3   Allergies  Allergen Reactions  . Shellfish Allergy Swelling  . Penicillins Itching   Social History   Social History  . Marital status: Married  . Number of children: 1  . 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 Smoking status: Never Smoker  . Smokeless tobacco: Never Used  . Alcohol use 0.6 - 1.2 oz/week    1 - 2 Standard drinks or equivalent per week     Comment: occasional  . Drug use: No  . Sexual activity: Not Currently   Objective:  VITAL SIGNS: BP 134/80 (BP Location: Left Arm, Patient Position: Sitting, Cuff Size: Large)   Pulse 64   Temp 98 F (  36.7 C) (Oral)   Ht 5\' 11"  (1.803 m)   Wt 250 lb 12.8 oz (113.8 kg)   SpO2 98%   BMI 34.98 kg/m  Constitutional: Well formed, well developed. No acute distress. HENT: Normocephalic, atraumatic.  Moist mucous membranes.  Eyes:  EOM grossly intact. Conjunctiva normal.  Neck:  Full range of motion.   Thorax & Lungs: nml effort, no accessory muscle use  Extremities: No clubbing. No cyanosis. No edema.  Skin: Warm. Dry. No erythema. No rash.  Musculoskeletal: low back.   Tenderness to palpation: yes- over left extensor muscle group Deformity: no Ecchymosis: no Straight leg test: neg Waddell's sign: negative for  No CVA tenderness, neg Lloyd's sign Neurologic: Normal sensory function. No focal deficits noted. DTR's equal and symmetry in LE's.  No clonus. Psychiatric: Normal mood. Age appropriate judgment and insight. Alert & oriented x 3.    Assessment:  Left-sided low back pain without sciatica - Plan: cyclobenzaprine (FLEXERIL) 10 MG tablet, naproxen sodium (ANAPROX) 275 MG tablet  Essential hypertension - Plan: atenolol (TENORMIN) 25 MG tablet   Plan: Orders as above. Attempted to do OMM, but he was too painful.  Home stretches and exercises also provided. Return in 2-3 weeks if symptoms worsen or fail to improve. Will discuss PT vs trigger pt injection. ED/immediately notify our office if he experiencing loss of bowel/bladder control. The patient voiced understanding and agreement to the plan.   [place]

## 2015-09-22 ENCOUNTER — Other Ambulatory Visit: Payer: Self-pay | Admitting: Internal Medicine

## 2015-09-26 NOTE — Telephone Encounter (Signed)
Rx(s) sent to pharmacy electronically.  

## 2015-10-31 ENCOUNTER — Other Ambulatory Visit: Payer: Self-pay | Admitting: Physician Assistant

## 2015-10-31 NOTE — Telephone Encounter (Signed)
eScribe request from [facility] for refill on Fenofibrate  Last filled - 10/26/2014, #30x5 Last AEX - 08/03/2014 Next AEX - Last visit was with Dr. Nani Ravens 09/18/15 for Acute Please Advise on refills/SLS 10/10

## 2015-11-01 NOTE — Telephone Encounter (Signed)
Rx to pharmacy by provider.

## 2016-02-22 ENCOUNTER — Telehealth: Payer: Self-pay | Admitting: Physician Assistant

## 2016-02-22 NOTE — Telephone Encounter (Signed)
Ok with me 

## 2016-02-22 NOTE — Telephone Encounter (Signed)
Patient would like to transfer from [place] to Dr. Wendling, please advise  °

## 2016-02-22 NOTE — Telephone Encounter (Signed)
OK 

## 2016-02-22 NOTE — Telephone Encounter (Signed)
Patient scheduled with PCP 02/23/2016

## 2016-02-26 ENCOUNTER — Encounter: Payer: Self-pay | Admitting: Family Medicine

## 2016-02-26 ENCOUNTER — Other Ambulatory Visit: Payer: Self-pay | Admitting: Family Medicine

## 2016-02-26 ENCOUNTER — Ambulatory Visit (INDEPENDENT_AMBULATORY_CARE_PROVIDER_SITE_OTHER): Payer: BLUE CROSS/BLUE SHIELD | Admitting: Family Medicine

## 2016-02-26 VITALS — BP 146/95 | HR 75 | Temp 98.0°F | Ht 71.0 in | Wt 252.0 lb

## 2016-02-26 DIAGNOSIS — I10 Essential (primary) hypertension: Secondary | ICD-10-CM | POA: Diagnosis not present

## 2016-02-26 DIAGNOSIS — D489 Neoplasm of uncertain behavior, unspecified: Secondary | ICD-10-CM

## 2016-02-26 DIAGNOSIS — E785 Hyperlipidemia, unspecified: Secondary | ICD-10-CM

## 2016-02-26 DIAGNOSIS — E784 Other hyperlipidemia: Secondary | ICD-10-CM

## 2016-02-26 DIAGNOSIS — R251 Tremor, unspecified: Secondary | ICD-10-CM | POA: Diagnosis not present

## 2016-02-26 DIAGNOSIS — Z114 Encounter for screening for human immunodeficiency virus [HIV]: Secondary | ICD-10-CM

## 2016-02-26 DIAGNOSIS — E669 Obesity, unspecified: Secondary | ICD-10-CM | POA: Diagnosis not present

## 2016-02-26 DIAGNOSIS — Z0000 Encounter for general adult medical examination without abnormal findings: Secondary | ICD-10-CM | POA: Diagnosis not present

## 2016-02-26 DIAGNOSIS — Z1211 Encounter for screening for malignant neoplasm of colon: Secondary | ICD-10-CM | POA: Diagnosis not present

## 2016-02-26 LAB — COMPREHENSIVE METABOLIC PANEL
ALBUMIN: 4.3 g/dL (ref 3.5–5.2)
ALK PHOS: 55 U/L (ref 39–117)
ALT: 28 U/L (ref 0–53)
AST: 35 U/L (ref 0–37)
BILIRUBIN TOTAL: 0.7 mg/dL (ref 0.2–1.2)
BUN: 14 mg/dL (ref 6–23)
CO2: 22 mEq/L (ref 19–32)
Calcium: 9.9 mg/dL (ref 8.4–10.5)
Chloride: 106 mEq/L (ref 96–112)
Creatinine, Ser: 0.92 mg/dL (ref 0.40–1.50)
GFR: 89.72 mL/min (ref >60.00)
GLUCOSE: 99 mg/dL (ref 70–99)
Potassium: 4 mEq/L (ref 3.5–5.1)
SODIUM: 138 meq/L (ref 135–145)
TOTAL PROTEIN: 7.6 g/dL (ref 6.0–8.3)

## 2016-02-26 LAB — LIPID PANEL
Cholesterol: 260 mg/dL — ABNORMAL HIGH (ref 0–200)
HDL: 44.3 mg/dL (ref >39.00)
NONHDL: 215.9
Total CHOL/HDL Ratio: 6
Triglycerides: 348 mg/dL — ABNORMAL HIGH (ref 0.0–149.0)
VLDL: 69.6 mg/dL — ABNORMAL HIGH (ref 0.0–40.0)

## 2016-02-26 LAB — HEMOGLOBIN A1C: HEMOGLOBIN A1C: 5.4 % (ref 4.6–6.5)

## 2016-02-26 LAB — HIV ANTIBODY (ROUTINE TESTING W REFLEX): HIV: NONREACTIVE

## 2016-02-26 LAB — LDL CHOLESTEROL, DIRECT: LDL DIRECT: 135 mg/dL

## 2016-02-26 MED ORDER — ROSUVASTATIN CALCIUM 20 MG PO TABS: 20.0000 mg | ORAL_TABLET | Freq: Every day | ORAL | 3 refills | Status: DC

## 2016-02-26 MED ORDER — LISINOPRIL 20 MG PO TABS: 20.0000 mg | ORAL_TABLET | Freq: Every day | ORAL | 1 refills | Status: AC

## 2016-02-26 MED ORDER — NITROGLYCERIN 0.4 MG SL SUBL: 0.4000 mg | SUBLINGUAL_TABLET | SUBLINGUAL | 12 refills | Status: AC | PRN

## 2016-02-26 MED ORDER — FENOFIBRATE 145 MG PO TABS: 145.0000 mg | ORAL_TABLET | Freq: Every day | ORAL | 3 refills | Status: AC

## 2016-02-26 NOTE — Progress Notes (Signed)
Chief Complaint  Patient presents with  . Transitions Of Care    and needing CPE-fasting-needing medications filled    Well Male Gregory Delacruz is here for a complete physical.   His last physical was >1 year ago.  Current diet: in general, a "unhealthy" diet   Current exercise: Walking Weight trend: increasing Does pt snore? No. Daytime fatigue? No. Seat belt? Yes.    6 mo- tremor on both hands. It is not located anywhere else in his body. He will have a tremor when he holds his hands in the ear. It is not affecting him when it is resting. He does not seem to get it when he uses his hands to perform activities. Eye sight can sometimes get blurry also. He did have a scan to r/o MS and it was neg.   He has also noticed a growing area on his left cheek. It has been there for around 12 months. He is concerned that his cancers or like it removed. It is not painful or itchy.  Past Medical History:  Diagnosis Date  . Breast lump   . Coronary artery disease    a. s/p DES to LAD (2014).  b. low risk MPS 11/2014.  Marland Kitchen Hyperlipidemia   . Hypertension   . Hypertriglyceridemia     Past Surgical History:  Procedure Laterality Date  . BICEPS TENDON REPAIR  1997  . CORONARY ANGIOPLASTY WITH STENT PLACEMENT  12/02/2012   stent to LAD    Medications  Current Outpatient Prescriptions on File Prior to Visit  Medication Sig Dispense Refill  . aspirin 81 MG tablet Take 81 mg by mouth daily.    Marland Kitchen atenolol (TENORMIN) 25 MG tablet Take 1 tablet (25 mg total) by mouth daily. 90 tablet 3  . EPINEPHrine 0.3 mg/0.3 mL IJ SOAJ injection Inject 0.3 mLs (0.3 mg total) into the muscle once. 1 Device 0  . fenofibrate (TRICOR) 145 MG tablet Take 1 tablet (145 mg total) by mouth daily. Need appointment for further refills 30 tablet 0  . nitroGLYCERIN (NITROSTAT) 0.4 MG SL tablet Place 1 tablet (0.4 mg total) under the tongue every 5 (five) minutes x 3 doses as needed for chest pain. 25 tablet 12  .  rosuvastatin (CRESTOR) 20 MG tablet Take 1 tablet (20 mg total) by mouth daily. PLEASE CONTACT OFFICE FOR ADDITIONAL REFILLS 90 tablet 0  . tiZANidine (ZANAFLEX) 2 MG tablet Take 2 mg by mouth every 8 (eight) hours as needed for muscle spasms.   0   Allergies Allergies  Allergen Reactions  . Shellfish Allergy Swelling  . Penicillins Itching   Family History Family History  Problem Relation Age of Onset  . Diabetes Mother   . Hypertension Mother   . Cancer Father 23    prostate   . Heart disease Father 7    triple bypass   . Diabetes Father   . Hypertension Father   . Stroke Maternal Grandmother   . Heart attack Paternal Grandmother   . Cancer Paternal Grandfather     Review of Systems: Constitutional:  no unexpected change in weight, no fevers or chills Eye:  no recent significant change in vision; will have intermittent blurry vision Ear/Nose/Mouth/Throat:  Ears:  no tinnitus or hearing loss Nose/Mouth/Throat:  no complaints of nasal congestion or bleeding, no sore throat and oral sores Cardiovascular:  no chest pain, no palpitations Respiratory:  no cough and no shortness of breath Gastrointestinal:  no abdominal pain, no change in bowel  habits, no nausea, vomiting, diarrhea, or constipation and no black or bloody stool GU:  Male: negative for dysuria, frequency, and incontinence and negative for prostate symptoms Musculoskeletal/Extremities:  no pain, redness, or swelling of the joints Integumentary (Skin/Breast): +growth on L cheek, otherwise no abnormal skin lesions reported Neurologic:  no headaches, no numbness, tingling, +tremor in hands Endocrine:  weight changes, masses in the neck, heat/cold intolerance, bowel or skin changes, or cardiovascular system symptoms Hematologic/Lymphatic:  no abnormal bleeding, no HIV risk factors, no night sweats, no swollen nodes, no weight loss  Exam BP (!) 146/95 (BP Location: Left Arm, Patient Position: Sitting, Cuff Size: Large)    Pulse 75   Temp 98 F (36.7 C) (Oral)   Ht 5\' 11"  (1.803 m)   Wt 252 lb (114.3 kg)   SpO2 97%   BMI 35.15 kg/m  General:  well developed, well nourished, in no apparent distress Skin:  no significant moles, warts, or growths Head:  no masses, lesions, or tenderness Eyes:  pupils equal and round, sclera anicteric without injection Ears:  canals without lesions, TMs shiny without retraction, no obvious effusion, no erythema Nose:  nares patent, septum midline, mucosa normal Throat/Pharynx:  lips and gingiva without lesion; tongue and uvula midline; non-inflamed pharynx; no exudates or postnasal drainage Neck: neck supple without adenopathy, thyromegaly, or masses Lungs:  clear to auscultation, breath sounds equal bilaterally, no respiratory distress Cardio:  regular rate and rhythm without murmurs, heart sounds without clicks or rubs Abdomen:  abdomen soft, nontender; bowel sounds normal; no masses or organomegaly Genital (male): nml penis, no lesions or discharge; testes present bilaterally without masses or tenderness Rectal: Deferred Musculoskeletal:  symmetrical muscle groups noted without atrophy or deformity Extremities:  no clubbing, cyanosis, or edema, no deformities, no skin discoloration Neuro:  gait normal; deep tendon reflexes normal and symmetric, when the hands were held in air, there was a small tremor appreciated throughout fingers. I did not appreciate any other forms of this elsewhere on his body. Psych: well oriented with normal range of affect and appropriate judgment/insight  Assessment and Plan  Well adult exam  Screen for colon cancer - Plan: Ambulatory referral to Gastroenterology  Dyslipidemia (high LDL; low HDL) - Plan: fenofibrate (TRICOR) 145 MG tablet, rosuvastatin (CRESTOR) 20 MG tablet, Lipid panel  Tremor  Neoplasm of uncertain behavior  Essential hypertension - Plan: lisinopril (PRINIVIL,ZESTRIL) 20 MG tablet, Comprehensive metabolic  panel  Obesity (BMI 35.0-39.9 without comorbidity) - Plan: Hemoglobin A1c  Encounter for screening for HIV - Plan: HIV antibody   Well 61 y.o. male. Counseled on diet and exercise. Given BP and hx of MI, will add ACEi. Immunizations, labs, and further orders as above. Follow up in 1 week to remove lesion on face. The patient voiced understanding and agreement to the plan.  [place], DO 02/26/16 12:12 PM

## 2016-02-26 NOTE — Patient Instructions (Signed)

## 2016-03-04 ENCOUNTER — Encounter: Payer: Self-pay | Admitting: Family Medicine

## 2016-03-04 ENCOUNTER — Ambulatory Visit (INDEPENDENT_AMBULATORY_CARE_PROVIDER_SITE_OTHER): Payer: BLUE CROSS/BLUE SHIELD | Admitting: Family Medicine

## 2016-03-04 VITALS — BP 110/86 | HR 87 | Temp 98.3°F | Ht 71.0 in | Wt 253.2 lb

## 2016-03-04 DIAGNOSIS — D489 Neoplasm of uncertain behavior, unspecified: Secondary | ICD-10-CM

## 2016-03-04 NOTE — Patient Instructions (Signed)
Do not shower/bathe for the rest of the day.  Only use soap and water.   Triple antibiotic ointment twice daily for next 7 days. Keep clean and dry, cover if going into public.   If you start having drainage, spreading redness, fevers, unrelenting pain, let us know or seek care.

## 2016-03-04 NOTE — Progress Notes (Signed)
Pre visit review using our clinic review tool, if applicable. No additional management support is needed unless otherwise documented below in the visit note. 

## 2016-03-04 NOTE — Progress Notes (Signed)
Chief Complaint  Patient presents with  . Skin procedure    area on (L) side of face   Procedure note; shave biopsy Informed consent was obtained. The area on L side of face was cleaned with alcohol and injected with 0.5 mL of 1% lidocaine with epinephrine. A Dermablade was slightly bent and used to cut under the area of interest. The specimen was placed in a sterile specimen cup and sent to the lab. The area was then electrically cauterized ensuring adequate hemostasis. The area was dressed with triple antibiotic ointment and a bandage. There were no complications noted. The patient tolerated the procedure well.  After care instructions given.  F/u prn. Pt voiced understanding and agreement to the plan.  [place], DO 03/04/16 11:52 AM

## 2016-03-24 NOTE — Progress Notes (Signed)
Cardiology Office Note    Date:  03/25/2016   ID:  Gregory Delacruz, DOB [birth year], MRN [id number]  PCP:  Shelda Pal, DO  Cardiologist: Dr. Debara Pickett   Chief Complaint  Patient presents with  . Follow-up    15 MONTH CHECK UP NO COMPLAINTS    History of Present Illness:    Gregory Delacruz is a 61 y.o. male with past medical history of CAD (s/p anterior STEMI with DES to LAD in 2014), HTN, and HLD who presents to the office today for follow-up.   Was last seen by Dr. Debara Pickett in 11/2014 and doing well from a cardiac perspective at that time. Reported a bump along his left breast and an ultrasound was ordered. 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 Was admitted to [facility] later that month for evaluation of chest pain. Cyclic troponin values remained negative and EKG was without acute ischemic changes. A nuclear stress test was performed and showed an EF of 55-65% with hypokinesis of the septum but no evidence of inducible ischemia and was overall a low-risk study.  Seen by PCP in 02/2016. Labs checked at that time showed total cholesterol 260, HDL 44, Triglycerides 348, non-HDL of 215, and direct LDL 135.  Hgb A1c 5.4. Creatinine 0.92. AST 35, ALT 28.  In talking with the patient today, he reports doing well from a cardiac perspective since 2016. He denies any recent episodes of chest discomfort or dyspnea with exertion. He is not overly active at baseline but recently purchased a Fitbit and is trying to increase his activity level.   He reports being on ASA, Atenolol, Lisinopril, and Fenofibrate but says he has not taking Crestor in over one year. He was confused about which medications were for cholesterol and thought Fenofibrate replaced Crestor.  At the end of the encounter, he does report noticing worsening memory over the past few years. Reports walking into a room and forgetting what he walked into the room for. Has occasional left-sided weakness which comes and goes,  lasting for several seconds at a time. Says he had been seen by her provider many years ago who was concerned he had MS but imaging at time showed no acute findings.   Past Medical History:  Diagnosis Date  . Breast lump   . Coronary artery disease    a. s/p DES to LAD (2014).  b. low risk MPS 11/2014.  Marland Kitchen Hyperlipidemia   . Hypertension   . Hypertriglyceridemia     Past Surgical History:  Procedure Laterality Date  . BICEPS TENDON REPAIR  1997  . CORONARY ANGIOPLASTY WITH STENT PLACEMENT  12/02/2012   stent to LAD     Current Medications: Outpatient Medications Prior to Visit  Medication Sig Dispense Refill  . aspirin 81 MG tablet Take 81 mg by mouth daily.    Marland Kitchen atenolol (TENORMIN) 25 MG tablet Take 1 tablet (25 mg total) by mouth daily. 90 tablet 3  . fenofibrate (TRICOR) 145 MG tablet Take 1 tablet (145 mg total) by mouth daily. 90 tablet 3  . lisinopril (PRINIVIL,ZESTRIL) 20 MG tablet Take 1 tablet (20 mg total) by mouth daily. 30 tablet 1  . nitroGLYCERIN (NITROSTAT) 0.4 MG SL tablet Place 1 tablet (0.4 mg total) under the tongue every 5 (five) minutes x 3 doses as needed for chest pain. 25 tablet 12  . rosuvastatin (CRESTOR) 20 MG tablet Take 1 tablet (20 mg total) by mouth daily. 90 tablet 3   No facility-administered medications  prior to visit.      Allergies:   Shellfish allergy and Penicillins   Social History   Social History  . Marital status: Married    Spouse name: N/A  . Number of children: 1  . 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 Smoking status: Never Smoker  . Smokeless tobacco: Never Used  . Alcohol use 0.6 - 1.2 oz/week    1 - 2 Standard drinks or equivalent per week     Comment: occasional  . Drug use: No  . Sexual activity: Not Currently   Other Topics Concern  . None   Social History Narrative  . None     Family History:  The patient's family history includes  Cancer in his paternal grandfather; Cancer (age of onset: 55) in his father; Diabetes in his father and mother; Heart attack in his paternal grandmother; Heart disease (age of onset: 103) in his father; Hypertension in his father and mother; Stroke in his maternal grandmother.   Review of Systems:   Please see the history of present illness.     General:  No chills, fever, night sweats or weight changes.  Cardiovascular:  No chest pain, dyspnea on exertion, edema, orthopnea, palpitations, paroxysmal nocturnal dyspnea. Dermatological: No rash, lesions/masses Respiratory: No cough, dyspnea Urologic: No hematuria, dysuria Abdominal:   No nausea, vomiting, diarrhea, bright red blood per rectum, melena, or hematemesis Neurologic:  No visual changes, wkns. Positive for memory deficits.  All other systems reviewed and are otherwise negative except as noted above.   Physical Exam:    VS:  BP (!) 158/96 (BP Location: Right Arm, Patient Position: Sitting, Cuff Size: Normal)   Pulse 96   Ht 6' (1.829 m)   Wt 249 lb (112.9 kg)   BMI 33.77 kg/m    General: Well developed, well nourished Caucasian male appearing in no acute distress. Head: Normocephalic, atraumatic, sclera non-icteric, no xanthomas, nares are without discharge.  Neck: No carotid bruits. JVD not elevated.  Lungs: Respirations regular and unlabored, without wheezes or rales.  Heart: Regular rate and rhythm. No S3 or S4.  No murmur, no rubs, or gallops appreciated. Abdomen: Soft, non-tender, non-distended with normoactive bowel sounds. No hepatomegaly. No rebound/guarding. No obvious abdominal masses. Msk:  Strength and tone appear normal for age. No joint deformities or effusions. Extremities: No clubbing or cyanosis. No edema.  Distal pedal pulses are 2+ bilaterally. Neuro: Alert and oriented X 3. Moves all extremities spontaneously. No focal deficits noted. Psych:  Responds to questions appropriately with a normal affect. Skin: No  rashes or lesions noted  Wt Readings from Last 3 Encounters:  03/25/16 249 lb (112.9 kg)  03/04/16 253 lb 3.2 oz (114.9 kg)  02/26/16 252 lb (114.3 kg)     Studies/Labs Reviewed:   EKG:  EKG is ordered today. The ekg ordered today demonstrates NSR, HR 97, with no acute ST or T-wave changes when compared to prior tracings.   Recent Labs: 02/26/2016: ALT 28; BUN 14; Creatinine, Ser 0.92; Potassium 4.0; Sodium 138   Lipid Panel    Component Value Date/Time   CHOL 260 (H) 02/26/2016 1024   TRIG 348.0 (H) 02/26/2016 1024   HDL 44.30 02/26/2016 1024   CHOLHDL 6 02/26/2016 1024   VLDL 69.6 (H) 02/26/2016 1024   LDLCALC 63 12/20/2014 1055   LDLDIRECT 135.0 02/26/2016 1024    Additional  studies/ records that were reviewed today include:   NST: 11/2014  The study is normal with no evidence of inducible ischemia.  This is a low risk study.  The left ventricular ejection fraction is normal (55-65%).  There was hypokinesis of the septum but overall normal systolic function.  Assessment:    1. Atherosclerosis of native coronary artery of native heart without angina pectoris   2. Essential hypertension   3. Dyslipidemia (high LDL; low HDL)      Plan:   In order of problems listed above:  1. CAD - s/p anterior STEMI with DES to LAD in 2014. NST in 11/2014 showed an EF of 55-65% with hypokinesis of the septum but no evidence of inducible ischemia and was overall a low-risk study. - he denies any recent chest discomfort or dyspnea with exertion. EKG is without acute ischemic changes.  - continue ASA, BB, ACE-I, and Fenofibrate. Will restart Crestor 20mg  daily.   2. HTN - BP elevated at 158/96 on initial check, 152/86 on recheck. Reports systolic readings in the 123456 at home, diastolic in the [id number] - [id number]. Says he did not take his blood pressure medications this morning.  - I informed the patient if systolic readings remain > [id number] or diastolic > 90, would recommend increasing  Lisinopril to 40mg  daily.   3. HLD - Lipid Panel in 02/2016 showed total cholesterol 260, HDL 44, Triglycerides 348, non-HDL of 215, and direct LDL 135. - he quit taking Crestor approximately 1 year ago due to thinking Fenofibrate replaced Crestor. - will restart Crestor 20mg  daily AND continue with Fenofibrate.  - will need repeat Lipid Panel/LFT's in 6-8 weeks (patient requests this to be obtained by his PCP's office). If cholesterol not at goal, would titrate Crestor to 40mg  daily if symptoms allow.    Medication Adjustments/Labs and Tests Ordered: Current medicines are reviewed at length with the patient today.  Concerns regarding medicines are outlined above.  Medication changes, Labs and Tests ordered today are listed in the Patient Instructions below. Patient Instructions  Your physician recommends that you continue on your current medications as directed. Please refer to the Current Medication list given to you today. IF  B/P GREATER  THAN  140/90 CONSISTENTLY, INCREASE LISINOPRIL TO 40 MG  EVERY DAY   Your physician recommends that you schedule a follow-up appointment in: [place] DR. HILTY     Arna Medici, [place]  03/25/2016 5:42 PM    [facility] [address], [place], [place]  [address] Phone: [phone]; Fax: [phone]  [address], [place], [place] [address] Phone: [phone]

## 2016-03-25 ENCOUNTER — Encounter: Payer: Self-pay | Admitting: Student

## 2016-03-25 ENCOUNTER — Encounter: Payer: Self-pay | Admitting: Family Medicine

## 2016-03-25 ENCOUNTER — Ambulatory Visit (INDEPENDENT_AMBULATORY_CARE_PROVIDER_SITE_OTHER): Payer: BLUE CROSS/BLUE SHIELD | Admitting: Student

## 2016-03-25 VITALS — BP 158/96 | HR 96 | Ht 72.0 in | Wt 249.0 lb

## 2016-03-25 DIAGNOSIS — I2510 Atherosclerotic heart disease of native coronary artery without angina pectoris: Secondary | ICD-10-CM

## 2016-03-25 DIAGNOSIS — E784 Other hyperlipidemia: Secondary | ICD-10-CM

## 2016-03-25 DIAGNOSIS — I10 Essential (primary) hypertension: Secondary | ICD-10-CM | POA: Diagnosis not present

## 2016-03-25 DIAGNOSIS — E785 Hyperlipidemia, unspecified: Secondary | ICD-10-CM

## 2016-03-25 MED ORDER — ROSUVASTATIN CALCIUM 20 MG PO TABS: 20.0000 mg | ORAL_TABLET | Freq: Every day | ORAL | 3 refills | Status: AC

## 2016-03-25 NOTE — Patient Instructions (Addendum)
Your physician recommends that you continue on your current medications as directed. Please refer to the Current Medication list given to you today. IF  B/P RUN  GREATER  THAN  140/90  CONSISTENTLY INCREASE LISINOPRIL TO   40 MG  EVERY DAY   Your physician recommends that you schedule a follow-up appointment in: [place]
